# Patient Record
Sex: Male | Born: 1950 | Race: Black or African American | Hispanic: No | Marital: Married | Smoking: Former smoker
Health system: Southern US, Community
[De-identification: ages and names within clinical notes are randomized; demographics above are authoritative.]

## PROBLEM LIST (undated history)

## (undated) DIAGNOSIS — N289 Disorder of kidney and ureter, unspecified: Secondary | ICD-10-CM

## (undated) DIAGNOSIS — H409 Unspecified glaucoma: Secondary | ICD-10-CM

## (undated) DIAGNOSIS — H269 Unspecified cataract: Secondary | ICD-10-CM

---

## 1998-10-21 ENCOUNTER — Encounter: Payer: Self-pay | Admitting: Internal Medicine

## 1998-10-21 ENCOUNTER — Ambulatory Visit (HOSPITAL_COMMUNITY): Admission: RE | Admit: 1998-10-21 | Discharge: 1998-10-21 | Payer: Self-pay | Admitting: Internal Medicine

## 1998-10-21 ENCOUNTER — Emergency Department (HOSPITAL_COMMUNITY): Admission: EM | Admit: 1998-10-21 | Discharge: 1998-10-21 | Payer: Self-pay | Admitting: Internal Medicine

## 2000-10-08 ENCOUNTER — Emergency Department (HOSPITAL_COMMUNITY): Admission: EM | Admit: 2000-10-08 | Discharge: 2000-10-09 | Payer: Self-pay | Admitting: Emergency Medicine

## 2000-10-09 ENCOUNTER — Encounter: Payer: Self-pay | Admitting: Emergency Medicine

## 2000-10-13 ENCOUNTER — Emergency Department (HOSPITAL_COMMUNITY): Admission: EM | Admit: 2000-10-13 | Discharge: 2000-10-13 | Payer: Self-pay | Admitting: Emergency Medicine

## 2000-10-13 ENCOUNTER — Encounter: Payer: Self-pay | Admitting: Emergency Medicine

## 2003-06-27 ENCOUNTER — Ambulatory Visit (HOSPITAL_COMMUNITY): Admission: RE | Admit: 2003-06-27 | Discharge: 2003-06-27 | Payer: Self-pay | Admitting: Gastroenterology

## 2003-09-04 ENCOUNTER — Emergency Department (HOSPITAL_COMMUNITY): Admission: EM | Admit: 2003-09-04 | Discharge: 2003-09-04 | Payer: Self-pay | Admitting: Emergency Medicine

## 2003-10-02 ENCOUNTER — Ambulatory Visit (HOSPITAL_COMMUNITY): Admission: RE | Admit: 2003-10-02 | Discharge: 2003-10-02 | Payer: Self-pay | Admitting: Orthopedic Surgery

## 2004-08-06 ENCOUNTER — Ambulatory Visit (HOSPITAL_COMMUNITY): Admission: RE | Admit: 2004-08-06 | Discharge: 2004-08-07 | Payer: Self-pay | Admitting: Otolaryngology

## 2004-08-06 ENCOUNTER — Encounter (INDEPENDENT_AMBULATORY_CARE_PROVIDER_SITE_OTHER): Payer: Self-pay | Admitting: Specialist

## 2005-09-06 ENCOUNTER — Emergency Department (HOSPITAL_COMMUNITY): Admission: EM | Admit: 2005-09-06 | Discharge: 2005-09-06 | Payer: Self-pay | Admitting: Emergency Medicine

## 2006-10-19 ENCOUNTER — Emergency Department (HOSPITAL_COMMUNITY): Admission: EM | Admit: 2006-10-19 | Discharge: 2006-10-19 | Payer: Self-pay | Admitting: Emergency Medicine

## 2008-02-03 IMAGING — CT CT ABDOMEN W/O CM
1 series · 15 of 32 positions shown, 19 images · IV contrast (agent unspecified)
Comparison: none

CLINICAL DATA: Left flank pain.
ABDOMEN CT WITHOUT CONTRAST:
TECHNIQUE: Multidetector CT imaging of the abdomen was performed following the standard protocol without IV contrast.
TECHNIQUE: Multidetector CT imaging of the pelvis was performed following the standard protocol without IV contrast.

[Series 2: 160 stone 5.0 b40f st · axial · 0.69mm/px · z∈[-418,-82]mm · 15 of 93 slices shown, 19 images]
[im 6/93  soft-tissue]
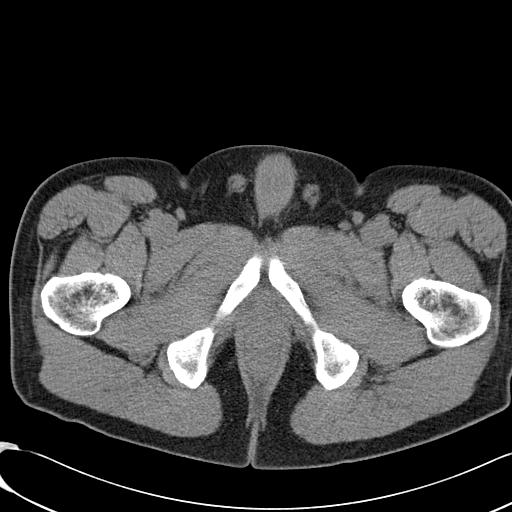
[im 6/93  bone]
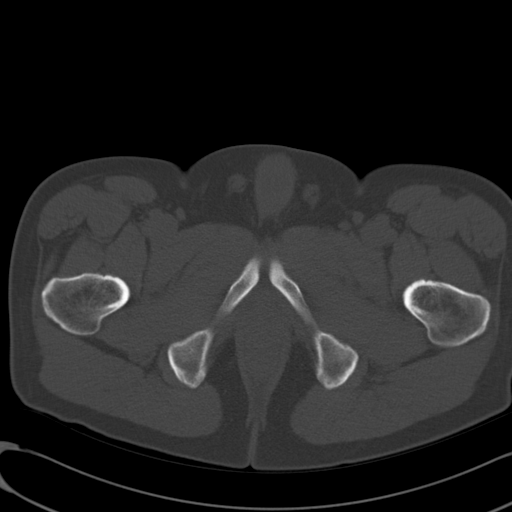
[im 12/93  soft-tissue]
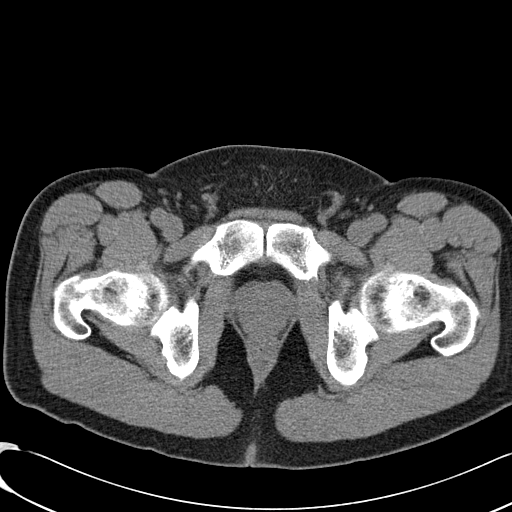
[im 18/93  soft-tissue]
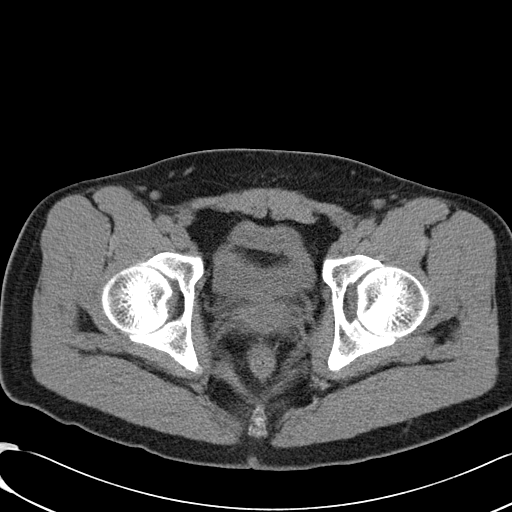
[im 27/93  soft-tissue]
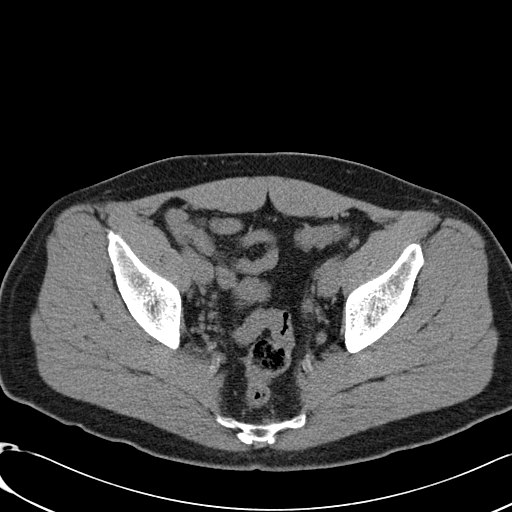
[im 33/93  soft-tissue]
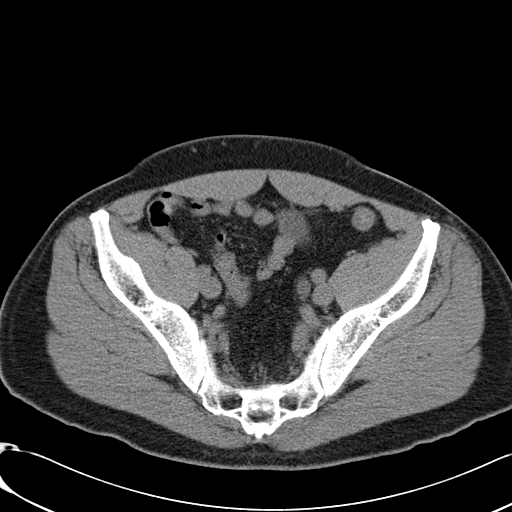
[im 39/93  soft-tissue]
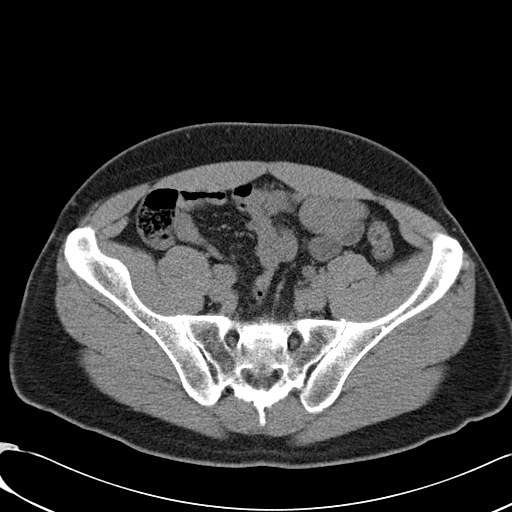
[im 48/93  soft-tissue]
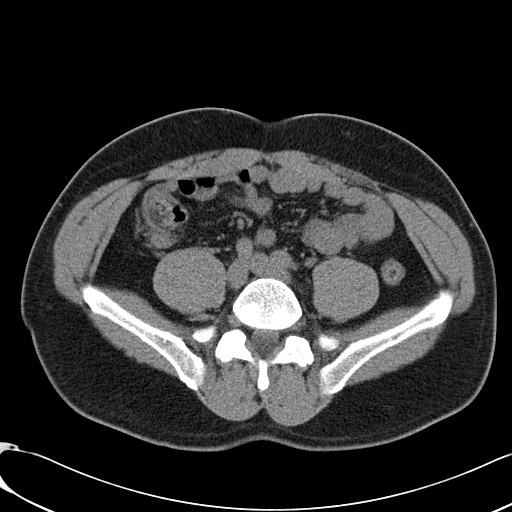
[im 54/93  soft-tissue]
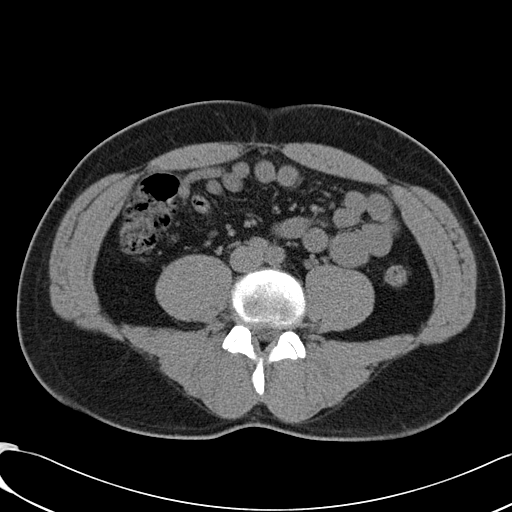
[im 60/93  soft-tissue]
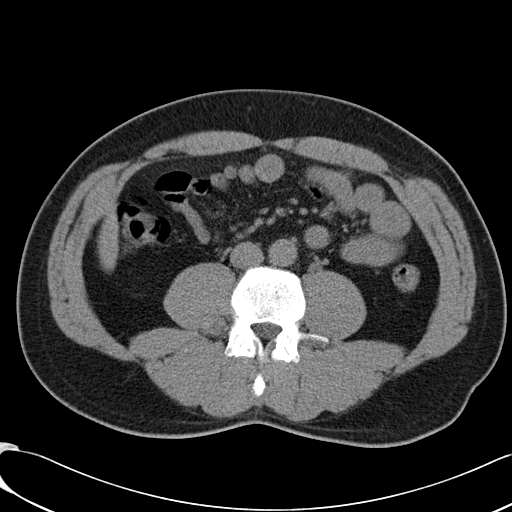
[im 60/93  bone]
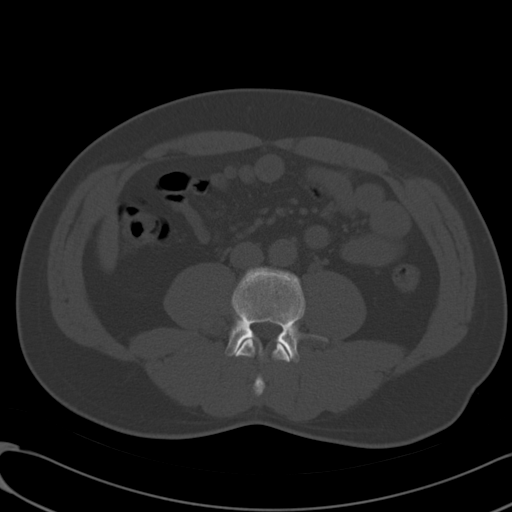
[im 66/93  soft-tissue]
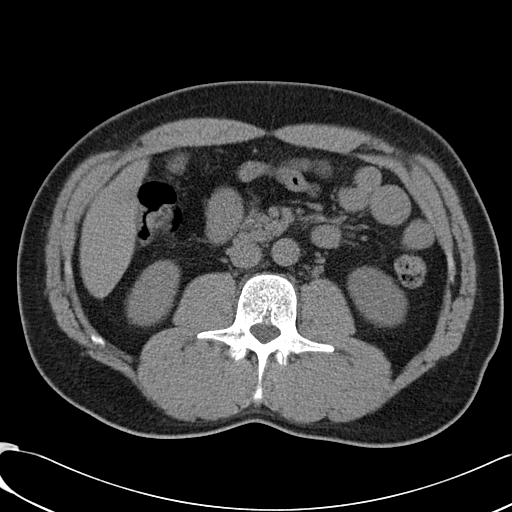
[im 75/93  soft-tissue]
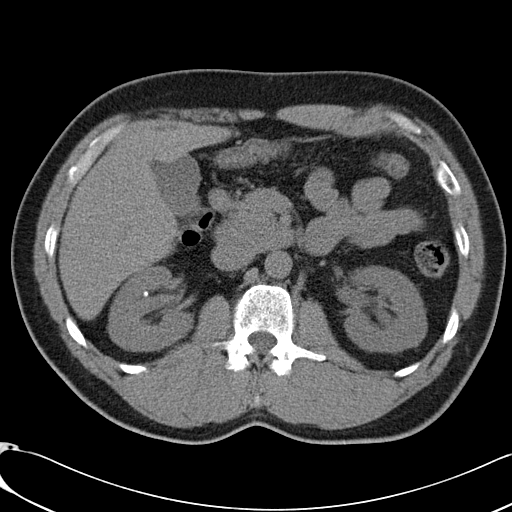
[im 81/93  soft-tissue]
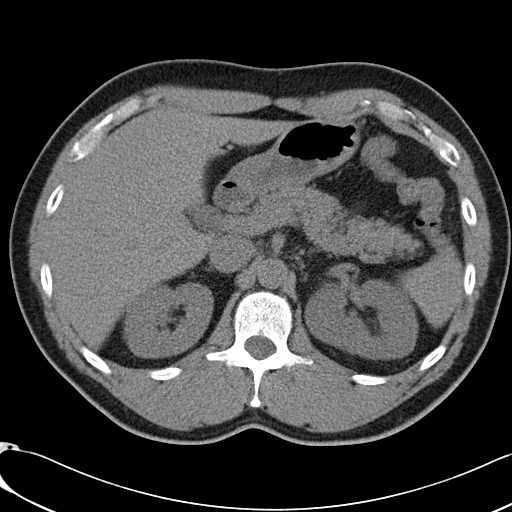
[im 81/93  lung]
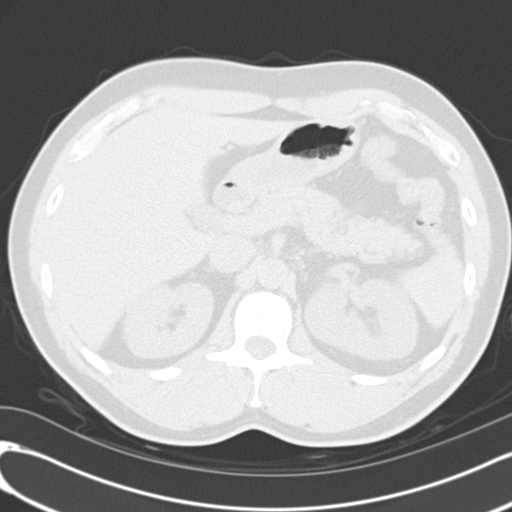
[im 84/93  lung]
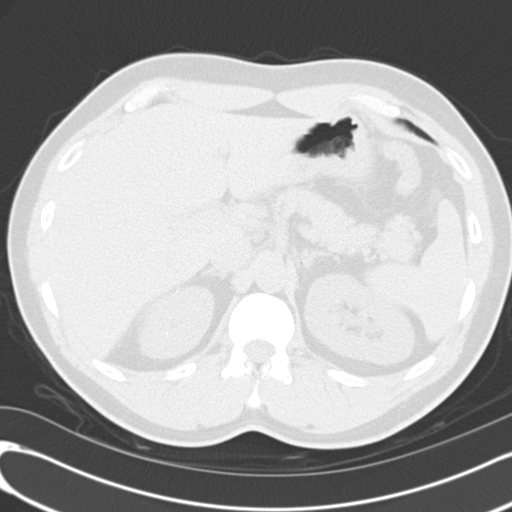
[im 87/93  soft-tissue]
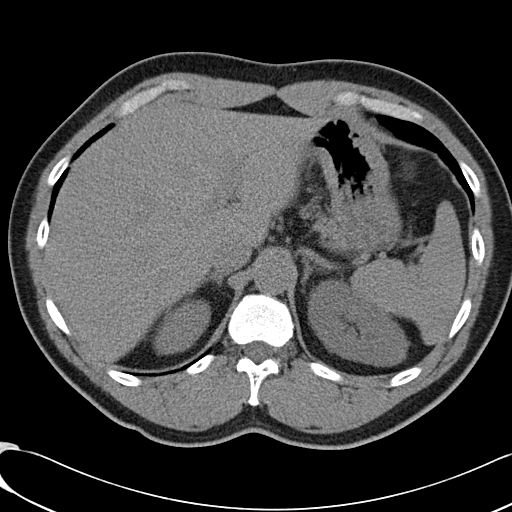
[im 87/93  lung]
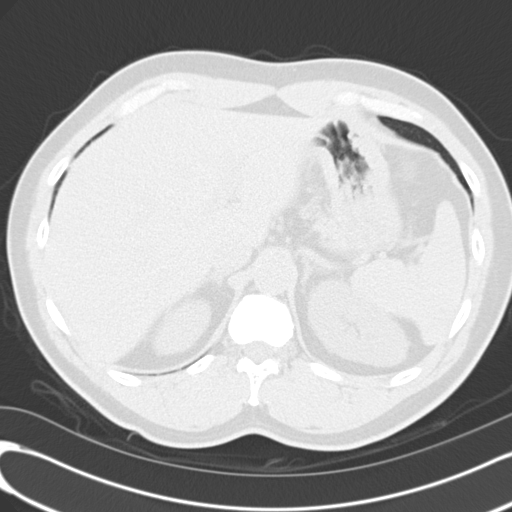
[im 90/93  lung]
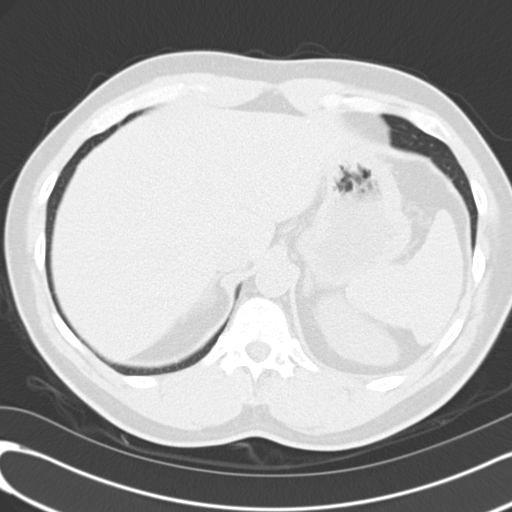

[15 of 32 positions shown; findings below may reference images not displayed]

FINDINGS: There are no renal calculi on the left.  Mild fullness of the left collecting system is present which may be due to a distal ureteral calculus as described in the pelvis report.  No perinephric edema or other signs of obstruction are seen.  There is a 4 mm stone in the right mid kidney without obstruction.  There is a 2.3 mm cyst in the right lower pole.  No adenopathy or mass is identified.
IMPRESSION: 1.  Question of mild obstruction of the left kidney due to a distal obstructing stone.  It is possible this is a phlebolith in the pelvis and there is no obstruction. 
2.  A 3 mm non-obstructing stone in the right mid kidney.
PELVIS CT WITHOUT CONTRAST:
FINDINGS: Calcification in the left mid pelvis on image #59 is felt to be due to arterial calcification.  There is a 3 mm round calcification in the left pelvis on image #70 which has some surrounding edema.  This is in the area of the ureter and could be a ureteral stone causing partial obstruction of the left ureter.  It is possible this is a phlebolith.  No bladder are seen.  The bowel is not dilated.  There is no free fluid.
IMPRESSION: A 3 mm calcification in the left lower pelvis which could be in the distal left ureter causing partial obstruction.  It is possible this is an unrelated phlebolith.

## 2010-06-27 NOTE — Op Note (Signed)
[name], Alan [name]                          ACCOUNT NO.:  [id number]   MEDICAL RECORD NO.:  [id number]                   PATIENT TYPE:  AMB   LOCATION:  ENDO                                 FACILITY:  [facility]   PHYSICIAN:  Anselmo Rod, M.D.               DATE OF BIRTH:  December 10, 1950   DATE OF PROCEDURE:  06/27/2003  DATE OF DISCHARGE:                                 OPERATIVE REPORT   PROCEDURE PERFORMED:  Screening colonoscopy.   ENDOSCOPIST:  Charna Elizabeth, M.D.   INSTRUMENT USED:  Olympus video colonoscope.   INDICATIONS FOR PROCEDURE:  The patient is a 61 year old African-American  male undergoing screening colonoscopy to rule out colonic polyps, masses,  etc.   PREPROCEDURE PREPARATION:  Informed consent was procured from the patient.  The patient was fasted for eight hours prior to the procedure and prepped  with a bottle of magnesium citrate and a gallon of GoLYTELY the night prior  to the procedure.   PREPROCEDURE PHYSICAL:  The patient had stable vital signs.  Neck supple.  Chest clear to auscultation.  S1 and S2 regular.  Abdomen soft with normal  bowel sounds.   DESCRIPTION OF PROCEDURE:  The patient was placed in left lateral decubitus  position and sedated with 90 mg of Demerol and 9 mg of Versed intravenously  in slow incremental doses.  Once the patient was adequately sedated and  maintained on low flow oxygen and continuous cardiac monitoring, the Olympus  video colonoscope was advanced from the rectum to the cecum.  The  appendicular orifice and ileocecal valve were clearly visualized and  photographed.  No masses, polyps, erosions, ulcerations or diverticula were  seen.  The patient had a fairly good prep.  Retroflexion in the rectum  revealed small internal hemorrhoids.  The patient tolerated the procedure  well without any immediate complications.   IMPRESSION:  Normal colonoscopy up to the cecum except for small internal  hemorrhoids.  No masses, polyps,  or diverticula were seen.   RECOMMENDATIONS:  1. Continue high fiber diet with liberal fluid intake.  2. Repeat colorectal cancer screening is recommended in the next 10 years     unless the patient develops any abnormal symptoms in the interim.  3. Outpatient followup as need arises in the future.                                               Anselmo Rod, M.D.    JNM/MEDQ  D:  06/27/2003  T:  06/27/2003  Job:  [id number]   cc:   Merlene Laughter. Renae Gloss, M.D.  [address]  [address]  [place]  [place] [address]  Fax: [phone]

## 2010-06-27 NOTE — Op Note (Signed)
NAMEKHAIDEN, REICHLE              ACCOUNT NO.:  [id number]   MEDICAL RECORD NO.:  [id number]          PATIENT TYPE:  OIB   LOCATION:  2550                         FACILITY:  [facility]   PHYSICIAN:  Jefry H. Pollyann Kennedy, MD     DATE OF BIRTH:  [birth year]   DATE OF PROCEDURE:  08/06/2004  DATE OF DISCHARGE:                                 OPERATIVE REPORT   PREOPERATIVE DIAGNOSES:  1.  Tonsillar hypertrophy.  2.  Elongation and thickening of uvula.  3.  Obstructive sleep apnea syndrome.   POSTOPERATIVE DIAGNOSES:  1.  Tonsillar hypertrophy.  2.  Elongation and thickening of uvula.  3.  Obstructive sleep apnea syndrome.   PROCEDURES:  1.  Tonsillectomy.  2.  Uvulectomy.   SURGEON:  Jefry H. Pollyann Kennedy, M.D.   ANESTHESIA:  General endotracheal anesthesia was used.   COMPLICATIONS:  None.   FINDINGS:  Moderate to severe enlargement of the tonsils with thickened,  elongated and redundant mucosa of the uvula.   REFERRING PHYSICIAN:  Merlene Laughter. Renae Gloss, M.D..   BLOOD LOSS:  30 mL.   SPECIMENS:  Tonsils and uvula sent for pathological evaluation.   HISTORY:  This is a 60 year old history of loud snoring and very mild  obstructive sleep apnea syndrome. The risks, benefits, alternatives and  complications of the procedure were explained to the patient, who seemed to  understand and agreed to surgery.   PROCEDURE:  The patient was taken to the operating room and placed on the  operative table in the supine position. Following induction of general  endotracheal anesthesia, the table was turned 90 degrees. The patient was  draped in standard fashion. The Crowe-Davis mouth gag was inserted into the  oral cavity and used to retract the tongue and mandible and attached to the  Mayo stand. A red rubber catheter was inserted into the right side of the  nose and withdrawn through the mouth and used to retract the soft palate and  uvula. Tonsillectomy was performed using electrocautery  dissection,  carefully dissecting the avascular plane between the capsule and constrictor  muscles. There was significant fibrosis around the capsule inferiorly on the  left side, but no signs of abscess or any tumor. The tonsils were sent for  pathologic evaluation along with the uvula when that was removed. The uvula  was removed using electrocautery dissection, dissecting along the anterior  mucosa through the muscular layer, which was very thickened and then through  the posterior layer slightly lower than the anterior layer. Spot cautery was  used as needed in the tonsillar beds and the uvula bed for hemostasis. The  mucosa was reapproximated along the corners and along the palate edge using  4-0 Vicryl suture. The lower aspects of the tonsillar fossae were left open.  The pharynx was suctioned of blood secretions and irrigated with saline  solution. An orogastric tube was used to aspirate the contents of the  stomach. The patient was then awakened, extubated and transferred to  recovery in stable condition.       JHR/MEDQ  D:  08/06/2004  T:  08/06/2004  Job:  [id number]   cc:   Merlene Laughter. Renae Gloss, M.D.  [address]  [address]  [place]  [place] [address]  Fax: [phone]

## 2010-11-21 LAB — URINALYSIS, ROUTINE W REFLEX MICROSCOPIC
Nitrite: NEGATIVE
Protein, ur: 100 — AB
Urobilinogen, UA: 1
pH: 6

## 2010-11-21 LAB — URINE MICROSCOPIC-ADD ON

## 2010-11-21 LAB — URINE CULTURE

## 2015-06-13 DIAGNOSIS — H25813 Combined forms of age-related cataract, bilateral: Secondary | ICD-10-CM | POA: Diagnosis not present

## 2015-06-13 DIAGNOSIS — H40003 Preglaucoma, unspecified, bilateral: Secondary | ICD-10-CM | POA: Diagnosis not present

## 2015-06-19 DIAGNOSIS — E785 Hyperlipidemia, unspecified: Secondary | ICD-10-CM | POA: Diagnosis not present

## 2015-06-19 DIAGNOSIS — H409 Unspecified glaucoma: Secondary | ICD-10-CM | POA: Diagnosis not present

## 2015-06-19 DIAGNOSIS — I10 Essential (primary) hypertension: Secondary | ICD-10-CM | POA: Diagnosis not present

## 2015-07-24 DIAGNOSIS — H40003 Preglaucoma, unspecified, bilateral: Secondary | ICD-10-CM | POA: Diagnosis not present

## 2015-12-11 DIAGNOSIS — Z0000 Encounter for general adult medical examination without abnormal findings: Secondary | ICD-10-CM | POA: Diagnosis not present

## 2015-12-11 DIAGNOSIS — E785 Hyperlipidemia, unspecified: Secondary | ICD-10-CM | POA: Diagnosis not present

## 2015-12-11 DIAGNOSIS — I10 Essential (primary) hypertension: Secondary | ICD-10-CM | POA: Diagnosis not present

## 2015-12-11 DIAGNOSIS — E559 Vitamin D deficiency, unspecified: Secondary | ICD-10-CM | POA: Diagnosis not present

## 2015-12-26 DIAGNOSIS — H40003 Preglaucoma, unspecified, bilateral: Secondary | ICD-10-CM | POA: Diagnosis not present

## 2016-06-10 DIAGNOSIS — E785 Hyperlipidemia, unspecified: Secondary | ICD-10-CM | POA: Diagnosis not present

## 2016-06-10 DIAGNOSIS — I10 Essential (primary) hypertension: Secondary | ICD-10-CM | POA: Diagnosis not present

## 2016-08-06 DIAGNOSIS — H25813 Combined forms of age-related cataract, bilateral: Secondary | ICD-10-CM | POA: Diagnosis not present

## 2016-08-06 DIAGNOSIS — H40003 Preglaucoma, unspecified, bilateral: Secondary | ICD-10-CM | POA: Diagnosis not present

## 2016-12-08 DIAGNOSIS — M542 Cervicalgia: Secondary | ICD-10-CM | POA: Diagnosis not present

## 2016-12-15 DIAGNOSIS — M542 Cervicalgia: Secondary | ICD-10-CM | POA: Diagnosis not present

## 2016-12-16 DIAGNOSIS — R7309 Other abnormal glucose: Secondary | ICD-10-CM | POA: Diagnosis not present

## 2016-12-16 DIAGNOSIS — M542 Cervicalgia: Secondary | ICD-10-CM | POA: Diagnosis not present

## 2016-12-16 DIAGNOSIS — Z125 Encounter for screening for malignant neoplasm of prostate: Secondary | ICD-10-CM | POA: Diagnosis not present

## 2016-12-16 DIAGNOSIS — I10 Essential (primary) hypertension: Secondary | ICD-10-CM | POA: Diagnosis not present

## 2016-12-16 DIAGNOSIS — Z0000 Encounter for general adult medical examination without abnormal findings: Secondary | ICD-10-CM | POA: Diagnosis not present

## 2016-12-16 DIAGNOSIS — E785 Hyperlipidemia, unspecified: Secondary | ICD-10-CM | POA: Diagnosis not present

## 2016-12-16 DIAGNOSIS — H409 Unspecified glaucoma: Secondary | ICD-10-CM | POA: Diagnosis not present

## 2016-12-22 DIAGNOSIS — M542 Cervicalgia: Secondary | ICD-10-CM | POA: Diagnosis not present

## 2017-06-09 DIAGNOSIS — I10 Essential (primary) hypertension: Secondary | ICD-10-CM | POA: Diagnosis not present

## 2017-06-09 DIAGNOSIS — E785 Hyperlipidemia, unspecified: Secondary | ICD-10-CM | POA: Diagnosis not present

## 2017-08-01 ENCOUNTER — Other Ambulatory Visit: Payer: Self-pay

## 2017-08-01 ENCOUNTER — Emergency Department (HOSPITAL_BASED_OUTPATIENT_CLINIC_OR_DEPARTMENT_OTHER)
Admission: EM | Admit: 2017-08-01 | Discharge: 2017-08-01 | Disposition: A | Discharge status: Home / Self Care | Payer: Medicare Other | Attending: Emergency Medicine | Admitting: Emergency Medicine

## 2017-08-01 ENCOUNTER — Encounter (HOSPITAL_BASED_OUTPATIENT_CLINIC_OR_DEPARTMENT_OTHER): Payer: Self-pay | Admitting: Emergency Medicine

## 2017-08-01 DIAGNOSIS — R109 Unspecified abdominal pain: Secondary | ICD-10-CM | POA: Insufficient documentation

## 2017-08-01 DIAGNOSIS — R1030 Lower abdominal pain, unspecified: Secondary | ICD-10-CM | POA: Diagnosis not present

## 2017-08-01 DIAGNOSIS — N200 Calculus of kidney: Secondary | ICD-10-CM | POA: Diagnosis not present

## 2017-08-01 HISTORY — DX: Disorder of kidney and ureter, unspecified: N28.9

## 2017-08-01 HISTORY — DX: Unspecified glaucoma: H40.9

## 2017-08-01 HISTORY — DX: Unspecified cataract: H26.9

## 2017-08-01 LAB — CBC WITH DIFFERENTIAL/PLATELET
BASOS PCT: 0 %
Basophils Absolute: 0 10*3/uL (ref 0.0–0.1)
EOS ABS: 0.1 10*3/uL (ref 0.0–0.7)
EOS PCT: 1 %
HEMATOCRIT: 44.4 % (ref 39.0–52.0)
HEMOGLOBIN: 16.4 g/dL (ref 13.0–17.0)
LYMPHS PCT: 46 %
Lymphs Abs: 2.9 10*3/uL (ref 0.7–4.0)
MCH: 31.1 pg (ref 26.0–34.0)
MCHC: 36.9 g/dL — AB (ref 30.0–36.0)
MCV: 84.1 fL (ref 78.0–100.0)
MONOS PCT: 12 %
Monocytes Absolute: 0.7 10*3/uL (ref 0.1–1.0)
NEUTROS ABS: 2.5 10*3/uL (ref 1.7–7.7)
NEUTROS PCT: 41 %
Platelets: 134 10*3/uL — ABNORMAL LOW (ref 150–400)
RBC: 5.28 MIL/uL (ref 4.22–5.81)
RDW: 13.2 % (ref 11.5–15.5)
WBC: 6.2 10*3/uL (ref 4.0–10.5)

## 2017-08-01 LAB — BASIC METABOLIC PANEL
Anion gap: 9 (ref 5–15)
BUN: 15 mg/dL (ref 6–20)
CO2: 26 mmol/L (ref 22–32)
Calcium: 9.2 mg/dL (ref 8.9–10.3)
Chloride: 107 mmol/L (ref 101–111)
Creatinine, Ser: 1.24 mg/dL (ref 0.61–1.24)
GFR, EST NON AFRICAN AMERICAN: 58 mL/min — AB (ref >60)
Glucose, Bld: 92 mg/dL (ref 65–99)
POTASSIUM: 4.2 mmol/L (ref 3.5–5.1)
SODIUM: 142 mmol/L (ref 135–145)

## 2017-08-01 LAB — URINALYSIS, MICROSCOPIC (REFLEX)
Bacteria, UA: NONE SEEN
RBC / HPF: >50 RBC/hpf (ref 0–5)

## 2017-08-01 LAB — URINALYSIS, ROUTINE W REFLEX MICROSCOPIC
BILIRUBIN URINE: NEGATIVE
Glucose, UA: NEGATIVE mg/dL
KETONES UR: 15 mg/dL — AB
Leukocytes, UA: NEGATIVE
Nitrite: NEGATIVE
PROTEIN: NEGATIVE mg/dL
Specific Gravity, Urine: 1.025 (ref 1.005–1.030)
pH: 6.5 (ref 5.0–8.0)

## 2017-08-01 MED ORDER — FENTANYL CITRATE (PF) 100 MCG/2ML IJ SOLN
50.0000 ug | INTRAMUSCULAR | Status: DC | PRN
  Administered 2017-08-01: 50 ug via INTRAVENOUS
  Filled 2017-08-01: qty 2

## 2017-08-01 MED ORDER — IBUPROFEN 800 MG PO TABS: 800.0000 mg | ORAL_TABLET | Freq: Three times a day (TID) | ORAL | 0 refills | Status: DC

## 2017-08-01 MED ORDER — ONDANSETRON HCL 4 MG/2ML IJ SOLN
4.0000 mg | Freq: Once | INTRAMUSCULAR | Status: AC
  Administered 2017-08-01: 4 mg via INTRAVENOUS
  Filled 2017-08-01: qty 2

## 2017-08-01 MED ORDER — TAMSULOSIN HCL 0.4 MG PO CAPS
0.4000 mg | ORAL_CAPSULE | Freq: Once | ORAL | Status: AC
  Administered 2017-08-01: 0.4 mg via ORAL
  Filled 2017-08-01: qty 1

## 2017-08-01 MED ORDER — KETOROLAC TROMETHAMINE 60 MG/2ML IM SOLN
30.0000 mg | Freq: Once | INTRAMUSCULAR | Status: AC
  Administered 2017-08-01: 30 mg via INTRAMUSCULAR
  Filled 2017-08-01: qty 2

## 2017-08-01 MED ORDER — HYDROMORPHONE HCL 1 MG/ML IJ SOLN
0.5000 mg | Freq: Once | INTRAMUSCULAR | Status: AC
  Administered 2017-08-01: 0.5 mg via INTRAVENOUS
  Filled 2017-08-01: qty 1

## 2017-08-01 MED ORDER — ONDANSETRON HCL 4 MG PO TABS: 4.0000 mg | ORAL_TABLET | Freq: Three times a day (TID) | ORAL | 0 refills | Status: DC | PRN

## 2017-08-01 MED ORDER — TAMSULOSIN HCL 0.4 MG PO CAPS: 0.4000 mg | ORAL_CAPSULE | Freq: Every day | ORAL | 0 refills | Status: DC

## 2017-08-01 MED ORDER — OXYCODONE-ACETAMINOPHEN 5-325 MG PO TABS: 1.0000 | ORAL_TABLET | Freq: Four times a day (QID) | ORAL | 0 refills | Status: DC | PRN

## 2017-08-01 NOTE — ED Notes (Signed)
Alert, NAD, calm, interactive, resps e/u, speaking in clear complete sentences, no dyspnea noted, skin W&D, pain decreased, "feel better", (denies: sob, NVD, fever, chills, dizziness or visual changes), h/o 6-7 kidney stones, last stone 10 yrs ago, seen by Dr. Lynnae Sandhoffahlsted ([facility]). [facility]

## 2017-08-01 NOTE — ED Notes (Signed)
Alert, NAD, calm, interactive, watching TV. Pending lab results.

## 2017-08-01 NOTE — ED Triage Notes (Signed)
Patient states that he has pain to his right upper abdominal area x 1 hour. The patient denies any N/V

## 2017-08-03 NOTE — ED Provider Notes (Signed)
[facility] EMERGENCY DEPARTMENT Provider Note   CSN: [id number] Arrival date & time: 08/01/17  2035     History   Chief Complaint Chief Complaint  Patient presents with  . Flank Pain    HPI Alan [name] is a 67 y.o. male.  The history is provided by the patient and the spouse.  Flank Pain  This is a recurrent problem. The current episode started 3 to 5 hours ago. The problem occurs constantly. The problem has been gradually worsening. Pertinent negatives include no chest pain and no abdominal pain. Nothing aggravates the symptoms. Nothing relieves the symptoms.    Past Medical History:  Diagnosis Date  . Cataracts, bilateral   . Glaucoma   . Renal disorder    kideny stone    There are no active problems to display for this patient.   History reviewed. No pertinent surgical history.      Home Medications    Prior to Admission medications   Medication Sig Start Date End Date Taking? Authorizing Provider  ibuprofen (ADVIL,MOTRIN) 800 MG tablet Take 1 tablet (800 mg total) by mouth 3 (three) times daily. 08/01/17   Lynnlee Revels, Barbara Cower, MD  ondansetron (ZOFRAN) 4 MG tablet Take 1 tablet (4 mg total) by mouth every 8 (eight) hours as needed for nausea or vomiting. 08/01/17   Preet Mangano, Barbara Cower, MD  oxyCODONE-acetaminophen (PERCOCET) 5-325 MG tablet Take 1-2 tablets by mouth every 6 (six) hours as needed for severe pain. 08/01/17   Anshi Jalloh, Barbara Cower, MD  tamsulosin (FLOMAX) 0.4 MG CAPS capsule Take 1 capsule (0.4 mg total) by mouth daily. 08/01/17   Santonio Speakman, Barbara Cower, MD    Family History History reviewed. No pertinent family history.  Social History Social History   Tobacco Use  . Smoking status: Never Smoker  . Smokeless tobacco: Never Used  Substance Use Topics  . Alcohol use: Never    Frequency: Never  . Drug use: Never     Allergies   Patient has no known allergies.   Review of Systems Review of Systems  Cardiovascular: Negative for chest pain.    Gastrointestinal: Negative for abdominal pain.  Genitourinary: Positive for flank pain.  All other systems reviewed and are negative.    Physical Exam Updated Vital Signs BP 140/85   Pulse (!) 52   Temp 98.5 F (36.9 C) (Oral)   Resp 20   Ht 5\' 10"  (1.778 m)   Wt 82.6 kg (182 lb)   SpO2 98%   BMI 26.11 kg/m   Physical Exam  Constitutional: He is oriented to person, place, and time. He appears well-developed and well-nourished.  HENT:  Head: Normocephalic and atraumatic.  Eyes: Conjunctivae and EOM are normal.  Neck: Normal range of motion.  Cardiovascular: Normal rate.  Pulmonary/Chest: Effort normal. No respiratory distress.  Abdominal: Soft. He exhibits no distension. There is no tenderness. There is no guarding.  Musculoskeletal: Normal range of motion.  Neurological: He is alert and oriented to person, place, and time.  Nursing note and vitals reviewed.    ED Treatments / Results  Labs (all labs ordered are listed, but only abnormal results are displayed) Labs Reviewed  URINALYSIS, ROUTINE W REFLEX MICROSCOPIC - Abnormal; Notable for the following components:      Result Value   APPearance CLOUDY (*)    Hgb urine dipstick LARGE (*)    Ketones, ur 15 (*)    All other components within normal limits  CBC WITH DIFFERENTIAL/PLATELET - Abnormal; Notable for the following  components:   MCHC 36.9 (*)    Platelets 134 (*)    All other components within normal limits  BASIC METABOLIC PANEL - Abnormal; Notable for the following components:   GFR calc non Af Amer 58 (*)    All other components within normal limits  URINALYSIS, MICROSCOPIC (REFLEX)    EKG None  Radiology No results found.  Procedures Procedures (including critical care time)  Medications Ordered in ED Medications  ondansetron (ZOFRAN) injection 4 mg (4 mg Intravenous Given 08/01/17 2058)  tamsulosin (FLOMAX) capsule 0.4 mg (0.4 mg Oral Given 08/01/17 2239)  ketorolac (TORADOL) injection 30  mg (30 mg Intramuscular Given 08/01/17 2238)  HYDROmorphone (DILAUDID) injection 0.5 mg (0.5 mg Intravenous Given 08/01/17 2239)     Initial Impression / Assessment and Plan / ED Course  I have reviewed the triage vital signs and the nursing notes.  Pertinent labs & imaging results that were available during my care of the patient were reviewed by me and considered in my medical decision making (see chart for details).     With history of kidney stones multiple times this feels exactly the same.  Is in his right lower quadrant but this generally where he gets his kidney stone pain.  He does not have any rebound or guarding.  White count is normal.  He is afebrile.  He has no nausea or vomiting to suggest appendicitis or other infectious causes for symptoms.  Will treat for kidney stone and have urology follow-up.  Final Clinical Impressions(s) / ED Diagnoses   Final diagnoses:  Right flank pain  Kidney stone    ED Discharge Orders        Ordered    oxyCODONE-acetaminophen (PERCOCET) 5-325 MG tablet  Every 6 hours PRN     08/01/17 2308    ondansetron (ZOFRAN) 4 MG tablet  Every 8 hours PRN     08/01/17 2308    tamsulosin (FLOMAX) 0.4 MG CAPS capsule  Daily     08/01/17 2308    ibuprofen (ADVIL,MOTRIN) 800 MG tablet  3 times daily     08/01/17 2308       Lliam Hoh, Barbara CowerJason, MD 08/03/17 1224

## 2017-09-01 DIAGNOSIS — H25812 Combined forms of age-related cataract, left eye: Secondary | ICD-10-CM | POA: Diagnosis not present

## 2017-09-01 DIAGNOSIS — H40003 Preglaucoma, unspecified, bilateral: Secondary | ICD-10-CM | POA: Diagnosis not present

## 2017-09-01 DIAGNOSIS — H25811 Combined forms of age-related cataract, right eye: Secondary | ICD-10-CM | POA: Diagnosis not present

## 2017-09-10 ENCOUNTER — Other Ambulatory Visit: Payer: Self-pay

## 2017-10-13 DIAGNOSIS — H25812 Combined forms of age-related cataract, left eye: Secondary | ICD-10-CM | POA: Diagnosis not present

## 2017-10-13 DIAGNOSIS — H5310 Unspecified subjective visual disturbances: Secondary | ICD-10-CM | POA: Diagnosis not present

## 2017-10-13 DIAGNOSIS — H25811 Combined forms of age-related cataract, right eye: Secondary | ICD-10-CM | POA: Diagnosis not present

## 2017-10-13 DIAGNOSIS — H40003 Preglaucoma, unspecified, bilateral: Secondary | ICD-10-CM | POA: Diagnosis not present

## 2017-11-25 DIAGNOSIS — Z23 Encounter for immunization: Secondary | ICD-10-CM | POA: Diagnosis not present

## 2017-12-22 ENCOUNTER — Ambulatory Visit: Payer: Medicare Other

## 2017-12-22 ENCOUNTER — Encounter: Payer: Self-pay | Admitting: Nurse Practitioner

## 2017-12-22 ENCOUNTER — Ambulatory Visit: Payer: Self-pay | Admitting: Nurse Practitioner

## 2017-12-22 ENCOUNTER — Ambulatory Visit (INDEPENDENT_AMBULATORY_CARE_PROVIDER_SITE_OTHER): Payer: Medicare Other | Admitting: Nurse Practitioner

## 2017-12-22 VITALS — BP 120/80 | HR 60 | Temp 97.6°F | Ht 68.5 in | Wt 181.0 lb

## 2017-12-22 VITALS — BP 128/80 | HR 60 | Temp 97.6°F | Ht 68.5 in | Wt 181.8 lb

## 2017-12-22 DIAGNOSIS — Z125 Encounter for screening for malignant neoplasm of prostate: Secondary | ICD-10-CM | POA: Diagnosis not present

## 2017-12-22 DIAGNOSIS — Z1159 Encounter for screening for other viral diseases: Secondary | ICD-10-CM | POA: Diagnosis not present

## 2017-12-22 DIAGNOSIS — Z0000 Encounter for general adult medical examination without abnormal findings: Secondary | ICD-10-CM

## 2017-12-22 DIAGNOSIS — I10 Essential (primary) hypertension: Secondary | ICD-10-CM | POA: Insufficient documentation

## 2017-12-22 LAB — POCT URINALYSIS DIPSTICK
Bilirubin, UA: NEGATIVE
Glucose, UA: NEGATIVE
Ketones, UA: NEGATIVE
LEUKOCYTES UA: NEGATIVE
NITRITE UA: NEGATIVE
PH UA: 7 (ref 5.0–8.0)
Protein, UA: NEGATIVE
Spec Grav, UA: 1.02 (ref 1.010–1.025)
UROBILINOGEN UA: 0.2 U/dL

## 2017-12-22 LAB — POCT UA - MICROALBUMIN
Albumin/Creatinine Ratio, Urine, POC: 30
CREATININE, POC: 200 mg/dL
Microalbumin Ur, POC: 10 mg/L

## 2017-12-22 NOTE — Patient Instructions (Signed)
[name] , Thank you for taking time to come for your Medicare Wellness Visit. I appreciate your ongoing commitment to your health goals. Please review the following plan we discussed and let me know if I can assist you in the future.   Screening recommendations/referrals: Colonoscopy: up to date Recommended yearly ophthalmology/optometry visit for glaucoma screening and checkup Recommended yearly dental visit for hygiene and checkup  Vaccinations: Influenza vaccine: 11/2017 Pneumococcal vaccine: pt. Will verify Tdap vaccine: 11/2012 Shingles vaccine: dec    Advanced directives: Advance directive discussed with you today. I have provided a copy for you to complete at home and have notarized. Once this is complete please bring a copy in to our office so we can scan it into your chart.  Conditions/risks identified: Overweight  Next appointment:   Preventive Care 6767 Years and Older, Male Preventive care refers to lifestyle choices and visits with your health care provider that can promote health and wellness. What does preventive care include?  A yearly physical exam. This is also called an annual well check.  Dental exams once or twice a year.  Routine eye exams. Ask your health care provider how often you should have your eyes checked.  Personal lifestyle choices, including:  Daily care of your teeth and gums.  Regular physical activity.  Eating a healthy diet.  Avoiding tobacco and drug use.  Limiting alcohol use.  Practicing safe sex.  Taking low doses of aspirin every day.  Taking vitamin and mineral supplements as recommended by your health care provider. What happens during an annual well check? The services and screenings done by your health care provider during your annual well check will depend on your age, overall health, lifestyle risk factors, and family history of disease. Counseling  Your health care provider may ask you questions about  your:  Alcohol use.  Tobacco use.  Drug use.  Emotional well-being.  Home and relationship well-being.  Sexual activity.  Eating habits.  History of falls.  Memory and ability to understand (cognition).  Work and work Astronomerenvironment. Screening  You may have the following tests or measurements:  Height, weight, and BMI.  Blood pressure.  Lipid and cholesterol levels. These may be checked every 5 years, or more frequently if you are over 67 years old.  Skin check.  Lung cancer screening. You may have this screening every year starting at age 67 if you have a 30-pack-year history of smoking and currently smoke or have quit within the past 15 years.  Fecal occult blood test (FOBT) of the stool. You may have this test every year starting at age 67.  Flexible sigmoidoscopy or colonoscopy. You may have a sigmoidoscopy every 5 years or a colonoscopy every 10 years starting at age 67.  Prostate cancer screening. Recommendations will vary depending on your family history and other risks.  Hepatitis C blood test.  Hepatitis B blood test.  Sexually transmitted disease (STD) testing.  Diabetes screening. This is done by checking your blood sugar (glucose) after you have not eaten for a while (fasting). You may have this done every 1-3 years.  Abdominal aortic aneurysm (AAA) screening. You may need this if you are a current or former smoker.  Osteoporosis. You may be screened starting at age 67 if you are at high risk. Talk with your health care provider about your test results, treatment options, and if necessary, the need for more tests. Vaccines  Your health care provider may recommend certain vaccines, such as:  Influenza vaccine. This is recommended every year.  Tetanus, diphtheria, and acellular pertussis (Tdap, Td) vaccine. You may need a Td booster every 10 years.  Zoster vaccine. You may need this after age 24.  Pneumococcal 13-valent conjugate (PCV13) vaccine.  One dose is recommended after age 9.  Pneumococcal polysaccharide (PPSV23) vaccine. One dose is recommended after age 72. Talk to your health care provider about which screenings and vaccines you need and how often you need them. This information is not intended to replace advice given to you by your health care provider. Make sure you discuss any questions you have with your health care provider. Document Released: 02/22/2015 Document Revised: 10/16/2015 Document Reviewed: 11/27/2014 Elsevier Interactive Patient Education  2017 [place] Prevention in the Home Falls can cause injuries. They can happen to people of all ages. There are many things you can do to make your home safe and to help prevent falls. What can I do on the outside of my home?  Regularly fix the edges of walkways and driveways and fix any cracks.  Remove anything that might make you trip as you walk through a door, such as a raised step or threshold.  Trim any bushes or trees on the path to your home.  Use bright outdoor lighting.  Clear any walking paths of anything that might make someone trip, such as rocks or tools.  Regularly check to see if handrails are loose or broken. Make sure that both sides of any steps have handrails.  Any raised decks and porches should have guardrails on the edges.  Have any leaves, snow, or ice cleared regularly.  Use sand or salt on walking paths during winter.  Clean up any spills in your garage right away. This includes oil or grease spills. What can I do in the bathroom?  Use night lights.  Install grab bars by the toilet and in the tub and shower. Do not use towel bars as grab bars.  Use non-skid mats or decals in the tub or shower.  If you need to sit down in the shower, use a plastic, non-slip stool.  Keep the floor dry. Clean up any water that spills on the floor as soon as it happens.  Remove soap buildup in the tub or shower regularly.  Attach bath  mats securely with double-sided non-slip rug tape.  Do not have throw rugs and other things on the floor that can make you trip. What can I do in the bedroom?  Use night lights.  Make sure that you have a light by your bed that is easy to reach.  Do not use any sheets or blankets that are too big for your bed. They should not hang down onto the floor.  Have a firm chair that has side arms. You can use this for support while you get dressed.  Do not have throw rugs and other things on the floor that can make you trip. What can I do in the kitchen?  Clean up any spills right away.  Avoid walking on wet floors.  Keep items that you use a lot in easy-to-reach places.  If you need to reach something above you, use a strong step stool that has a grab bar.  Keep electrical cords out of the way.  Do not use floor polish or wax that makes floors slippery. If you must use wax, use non-skid floor wax.  Do not have throw rugs and other things on the floor that  can make you trip. What can I do with my stairs?  Do not leave any items on the stairs.  Make sure that there are handrails on both sides of the stairs and use them. Fix handrails that are broken or loose. Make sure that handrails are as long as the stairways.  Check any carpeting to make sure that it is firmly attached to the stairs. Fix any carpet that is loose or worn.  Avoid having throw rugs at the top or bottom of the stairs. If you do have throw rugs, attach them to the floor with carpet tape.  Make sure that you have a light switch at the top of the stairs and the bottom of the stairs. If you do not have them, ask someone to add them for you. What else can I do to help prevent falls?  Wear shoes that:  Do not have high heels.  Have rubber bottoms.  Are comfortable and fit you well.  Are closed at the toe. Do not wear sandals.  If you use a stepladder:  Make sure that it is fully opened. Do not climb a closed  stepladder.  Make sure that both sides of the stepladder are locked into place.  Ask someone to hold it for you, if possible.  Clearly mark and make sure that you can see:  Any grab bars or handrails.  First and last steps.  Where the edge of each step is.  Use tools that help you move around (mobility aids) if they are needed. These include:  Canes.  Walkers.  Scooters.  Crutches.  Turn on the lights when you go into a dark area. Replace any light bulbs as soon as they burn out.  Set up your furniture so you have a clear path. Avoid moving your furniture around.  If any of your floors are uneven, fix them.  If there are any pets around you, be aware of where they are.  Review your medicines with your doctor. Some medicines can make you feel dizzy. This can increase your chance of falling. Ask your doctor what other things that you can do to help prevent falls. This information is not intended to replace advice given to you by your health care provider. Make sure you discuss any questions you have with your health care provider. Document Released: 11/22/2008 Document Revised: 07/04/2015 Document Reviewed: 03/02/2014 Elsevier Interactive Patient Education  2017 [employer].

## 2017-12-22 NOTE — Progress Notes (Signed)
Subjective:     Patient ID: Alan [name] , male    DOB: 1950-12-26 , 66 y.o.   MRN: [id number]   Chief Complaint  Patient presents with  . Hypertension    HPI  Hypertension  This is a chronic problem. The current episode started more than 1 year ago. The problem is unchanged. The problem is controlled. Pertinent negatives include no anxiety, chest pain, headaches or palpitations.     Past Medical History:  Diagnosis Date  . Cataracts, bilateral   . Glaucoma   . Renal disorder    kideny stone     No family history on file.   Current Outpatient Medications:  .  amLODipine (NORVASC) 5 MG tablet, Take 5 mg by mouth daily., Disp: , Rfl:  .  Travoprost, BAK Free, (TRAVATAN Z) 0.004 % SOLN ophthalmic solution, Place 1 drop into both eyes at bedtime., Disp: , Rfl:    No Known Allergies   Review of Systems  Constitutional: Negative.   HENT: Negative.   Eyes: Negative.   Respiratory: Negative.   Cardiovascular: Negative.  Negative for chest pain, palpitations and leg swelling.  Gastrointestinal: Negative.   Endocrine: Negative.   Genitourinary: Negative.   Musculoskeletal: Negative.   Skin: Negative.   Allergic/Immunologic: Negative.   Neurological: Negative.  Negative for dizziness and headaches.  Hematological: Negative.   Psychiatric/Behavioral: Negative.      Today's Vitals   12/22/17 1504  BP: 128/80  Pulse: 60  Temp: 97.6 F (36.4 C)  SpO2: 98%  Weight: 181 lb 12.8 oz (82.5 kg)  Height: 5' 8.5" (1.74 m)  PainSc: 0-No pain   Body mass index is 27.24 kg/m.   Objective:  Physical Exam  Constitutional: He is oriented to person, place, and time. He appears well-developed and well-nourished.  Eyes: Pupils are equal, round, and reactive to light. Conjunctivae and EOM are normal.  Neck: Normal range of motion. Neck supple.  Cardiovascular: Normal rate, regular rhythm and normal heart sounds.  Pulmonary/Chest: Effort normal and breath sounds normal.   Abdominal: Soft. Bowel sounds are normal.  Musculoskeletal: Normal range of motion.  Neurological: He is alert and oriented to person, place, and time.  Skin: Skin is warm and dry. Capillary refill takes less than 2 seconds. No erythema.  Psychiatric: He has a normal mood and affect.        Assessment And Plan:   1. Essential hypertension  Chronic, controlled  Continue with current medications  His annual wellness exam was done today - CBC no Diff - BMP8+eGFR - Lipid Profile - POCT Urinalysis Dipstick (81002) - POCT UA - Microalbumin  2. Encounter for prostate cancer screening   3. Encounter for hepatitis C screening test for low risk patient  He falls in the high risk age range, will screen for Hepatitis  - Hepatitis C antibody   Arnette Felts, FNP

## 2017-12-22 NOTE — Addendum Note (Signed)
Addended by: Arnette FeltsMOORE, Olive Motyka F on: 12/22/2017 06:39 PM   Modules accepted: Orders

## 2017-12-22 NOTE — Progress Notes (Signed)
Subjective:   Alan [name] is a 67 y.o. male who presents for Medicare Annual/Subsequent preventive examination.  Review of Systems:  n/a Cardiac Risk Factors include: advanced age (>72men, >18 women);male gender     Objective:    Vitals: BP 120/80 (BP Location: Right Arm)   Pulse 60   Temp 97.6 F (36.4 C)   Ht 5' 8.5" (1.74 m)   Wt 181 lb (82.1 kg)   BMI 27.12 kg/m   Body mass index is 27.12 kg/m.  Advanced Directives 12/22/2017 08/01/2017  Does Patient Have a Medical Advance Directive? No No  Would patient like information on creating a medical advance directive? Yes (MAU/Ambulatory/Procedural Areas - Information given) No - Patient declined    Tobacco Social History   Tobacco Use  Smoking Status Never Smoker  Smokeless Tobacco Never Used     Counseling given: Not Answered   Clinical Intake:  Pre-visit preparation completed: Yes  Pain : No/denies pain Pain Score: 0-No pain     Nutritional Status: BMI 25 -29 Overweight Diabetes: No  How often do you need to have someone help you when you read instructions, pamphlets, or other written materials from your doctor or pharmacy?: 1 - Never What is the last grade level you completed in school?: 2 years college  Interpreter Needed?: No  Information entered by :: NAllen LPN  Past Medical History:  Diagnosis Date  . Cataracts, bilateral   . Glaucoma   . Renal disorder    kideny stone   History reviewed. No pertinent surgical history. History reviewed. No pertinent family history. Social History   Socioeconomic History  . Marital status: Married    Spouse name: Not on file  . Number of children: Not on file  . Years of education: Not on file  . Highest education level: Not on file  Occupational History  . Not on file  Social Needs  . Financial resource strain: Not hard at all  . Food insecurity:    Worry: Never true    Inability: Never true  . Transportation needs:    Medical: No   Non-medical: No  Tobacco Use  . Smoking status: Never Smoker  . Smokeless tobacco: Never Used  Substance and Sexual Activity  . Alcohol use: Never    Frequency: Never  . Drug use: Never  . Sexual activity: Yes  Lifestyle  . Physical activity:    Days per week: 0 days    Minutes per session: 0 min  . Stress: Not at all  Relationships  . Social connections:    Talks on phone: Not on file    Gets together: Not on file    Attends religious service: Not on file    Active member of club or organization: Not on file    Attends meetings of clubs or organizations: Not on file    Relationship status: Not on file  Other Topics Concern  . Not on file  Social History Narrative  . Not on file    Outpatient Encounter Medications as of 12/22/2017  Medication Sig  . amLODipine (NORVASC) 5 MG tablet Take 5 mg by mouth daily.  . Timolol Maleate (ISTALOL) 0.5 % (DAILY) SOLN   . Travoprost, BAK Free, (TRAVATAN Z) 0.004 % SOLN ophthalmic solution Place 1 drop into both eyes at bedtime.   No facility-administered encounter medications on file as of 12/22/2017.     Activities of Daily Living In your present state of health, do you have any difficulty  performing the following activities: 12/22/2017  Hearing? N  Vision? N  Difficulty concentrating or making decisions? N  Walking or climbing stairs? N  Dressing or bathing? N  Doing errands, shopping? N  Preparing Food and eating ? N  Using the Toilet? N  In the past six months, have you accidently leaked urine? N  Do you have problems with loss of bowel control? N  Managing your Medications? N  Managing your Finances? N  Housekeeping or managing your Housekeeping? N  Some recent data might be hidden    Patient Care Team: Dorothyann Peng, MD as PCP - General (Internal Medicine)   Assessment:   This is a routine wellness examination for [name].  Exercise Activities and Dietary recommendations Current Exercise Habits: The patient has a  physically strenous job, but has no regular exercise apart from work., Exercise limited by: None identified  Goals   None     Fall Risk Fall Risk  12/22/2017 12/22/2017 09/10/2017  Falls in the past year? 0 0 No  Comment - - Emmi Telephone Survey: data to providers prior to load  Risk for fall due to : Medication side effect - -   Is the patient's home free of loose throw rugs in walkways, pet beds, electrical cords, etc?   yes      Grab bars in the bathroom? no      Handrails on the stairs?   yes      Adequate lighting?   yes  Timed Get Up and Go Performed: n/a  Depression Screen PHQ 2/9 Scores 12/22/2017 12/22/2017  PHQ - 2 Score 0 0    Cognitive Function     6CIT Screen 12/22/2017  What Year? 0 points  What month? 0 points  What time? 0 points  Count back from 20 0 points  Months in reverse 0 points  Repeat phrase 0 points  Total Score 0    Immunization History  Administered Date(s) Administered  . Influenza-Unspecified 11/22/2017    Qualifies for Shingles Vaccine? yes  Screening Tests Health Maintenance  Topic Date Due  . Hepatitis C Screening  [birth year]  . PNA vac Low Risk Adult (1 of 2 - PCV13) 06/21/2015  . TETANUS/TDAP  11/25/2022  . COLONOSCOPY  08/19/2023  . INFLUENZA VACCINE  Completed   Cancer Screenings: Lung: Low Dose CT Chest recommended if Age 84-80 years, 30 pack-year currently smoking OR have quit w/in 15years. Patient does not qualify. Colorectal: up to date  Additional Screenings:  Hepatitis C Screening:due      Plan:    Patient is checking to see if he has had pneumonia vaccines with the [place].  I have personally reviewed and noted the following in the patient's chart:   . Medical and social history . Use of alcohol, tobacco or illicit drugs  . Current medications and supplements . Functional ability and status . Nutritional status . Physical activity . Advanced directives . List of other physicians . Hospitalizations,  surgeries, and ER visits in previous 12 months . Vitals . Screenings to include cognitive, depression, and falls . Referrals and appointments  In addition, I have reviewed and discussed with patient certain preventive protocols, quality metrics, and best practice recommendations. A written personalized care plan for preventive services as well as general preventive health recommendations were provided to patient.     Barb Merino, LPN  16/11/9602

## 2017-12-23 LAB — BMP8+EGFR
BUN/Creatinine Ratio: 12 (ref 10–24)
BUN: 13 mg/dL (ref 8–27)
CALCIUM: 9.2 mg/dL (ref 8.6–10.2)
CHLORIDE: 105 mmol/L (ref 96–106)
CO2: 26 mmol/L (ref 20–29)
Creatinine, Ser: 1.07 mg/dL (ref 0.76–1.27)
GFR calc non Af Amer: 71 mL/min/{1.73_m2} (ref >59)
GFR, EST AFRICAN AMERICAN: 83 mL/min/{1.73_m2} (ref >59)
Glucose: 78 mg/dL (ref 65–99)
POTASSIUM: 4.2 mmol/L (ref 3.5–5.2)
Sodium: 142 mmol/L (ref 134–144)

## 2017-12-23 LAB — CBC
Hematocrit: 42.4 % (ref 37.5–51.0)
Hemoglobin: 15.1 g/dL (ref 13.0–17.7)
MCH: 31.5 pg (ref 26.6–33.0)
MCHC: 35.6 g/dL (ref 31.5–35.7)
MCV: 89 fL (ref 79–97)
PLATELETS: 127 10*3/uL — AB (ref 150–450)
RBC: 4.79 x10E6/uL (ref 4.14–5.80)
RDW: 13.4 % (ref 12.3–15.4)
WBC: 4.6 10*3/uL (ref 3.4–10.8)

## 2017-12-23 LAB — LIPID PANEL
Chol/HDL Ratio: 3.8 ratio (ref 0.0–5.0)
Cholesterol, Total: 176 mg/dL (ref 100–199)
HDL: 46 mg/dL (ref >39)
LDL Calculated: 119 mg/dL — ABNORMAL HIGH (ref 0–99)
Triglycerides: 57 mg/dL (ref 0–149)
VLDL Cholesterol Cal: 11 mg/dL (ref 5–40)

## 2017-12-23 LAB — HEPATITIS C ANTIBODY: Hep C Virus Ab: <0.1 s/co ratio (ref 0.0–0.9)

## 2017-12-27 ENCOUNTER — Telehealth: Payer: Self-pay | Admitting: Nurse Practitioner

## 2017-12-27 NOTE — Telephone Encounter (Addendum)
  We can refer him to a specialist if he is interested?  Is he taking an aspirin daily as well    ----- Message from [facility] Roman [place], [place] sent at 12/23/2017  4:33 PM EST ----- Patient notified he stated he has seen some random bruising in the last 5 months. YRL,RMA

## 2018-01-13 ENCOUNTER — Other Ambulatory Visit: Payer: Self-pay | Admitting: Nurse Practitioner

## 2018-01-28 NOTE — Telephone Encounter (Signed)
Patient stated he is not interested in seeing a specialist at this particular moment is it really necessary that he see one? And he also stated he is not currently taking aspirin and you recommended him to take vitamin b but didn't specify which one? YRL,RMA

## 2018-02-01 NOTE — Telephone Encounter (Signed)
A B complex vitamin is fine, lowest dose available. Okay about the aspirin

## 2018-02-04 NOTE — Telephone Encounter (Signed)
Advised pt that we will check his bloodwork at his next appointment and if Arnette FeltsJanece Moore FNP-BC feels he needs the referral she will refer him then. YRL,RMA

## 2018-02-04 NOTE — Telephone Encounter (Signed)
Left pt v/m to call office. YRL,RMA 

## 2018-03-04 DIAGNOSIS — H25811 Combined forms of age-related cataract, right eye: Secondary | ICD-10-CM | POA: Diagnosis not present

## 2018-03-04 DIAGNOSIS — H5310 Unspecified subjective visual disturbances: Secondary | ICD-10-CM | POA: Diagnosis not present

## 2018-03-04 DIAGNOSIS — H25812 Combined forms of age-related cataract, left eye: Secondary | ICD-10-CM | POA: Diagnosis not present

## 2018-03-04 DIAGNOSIS — H40003 Preglaucoma, unspecified, bilateral: Secondary | ICD-10-CM | POA: Diagnosis not present

## 2018-03-24 ENCOUNTER — Ambulatory Visit (INDEPENDENT_AMBULATORY_CARE_PROVIDER_SITE_OTHER): Payer: Medicare Other | Admitting: Nurse Practitioner

## 2018-03-24 ENCOUNTER — Encounter: Payer: Self-pay | Admitting: Nurse Practitioner

## 2018-03-24 VITALS — BP 110/70 | HR 67 | Temp 97.7°F | Ht 68.8 in | Wt 188.2 lb

## 2018-03-24 DIAGNOSIS — I10 Essential (primary) hypertension: Secondary | ICD-10-CM

## 2018-03-24 NOTE — Progress Notes (Signed)
Subjective:     Patient ID: Alan [name] , male    DOB: 03-Jan-1951 , 68 y.o.   MRN: [id number]   Chief Complaint  Patient presents with  . Hypertension    HPI  Hypertension  This is a chronic problem. The current episode started more than 1 year ago. The problem is unchanged. The problem is controlled. Pertinent negatives include no anxiety, chest pain, headaches or palpitations. There are no associated agents to hypertension. Risk factors for coronary artery disease include male gender. Past treatments include calcium channel blockers. There are no compliance problems.  There is no history of angina. There is no history of chronic renal disease.     Past Medical History:  Diagnosis Date  . Cataracts, bilateral   . Glaucoma   . Renal disorder    kideny stone     No family history on file.   Current Outpatient Medications:  .  amLODipine (NORVASC) 5 MG tablet, TAKE 1 TABLET DAILY, Disp: 90 tablet, Rfl: 1 .  Timolol Maleate (ISTALOL) 0.5 % (DAILY) SOLN, , Disp: , Rfl:  .  Travoprost, BAK Free, (TRAVATAN Z) 0.004 % SOLN ophthalmic solution, Place 1 drop into both eyes at bedtime., Disp: , Rfl:    No Known Allergies   Review of Systems  Constitutional: Negative for fatigue.  Respiratory: Negative.   Cardiovascular: Negative.  Negative for chest pain, palpitations and leg swelling.       Occasional leg swelling 3-4 times per year.    Skin: Negative.  Negative for rash.  Neurological: Positive for dizziness (intermittently when bending and at rest). Negative for headaches.     Today's Vitals   03/24/18 1539  BP: 110/70  Pulse: 67  Temp: 97.7 F (36.5 C)  TempSrc: Oral  SpO2: 94%  Weight: 188 lb 3.2 oz (85.4 kg)  Height: 5' 8.8" (1.748 m)  PainSc: 0-No pain   Body mass index is 27.95 kg/m.   Objective:  Physical Exam Vitals signs reviewed.  Constitutional:      Appearance: Normal appearance.  Cardiovascular:     Rate and Rhythm: Normal rate and regular  rhythm.     Pulses: Normal pulses.     Heart sounds: No murmur.  Pulmonary:     Effort: Pulmonary effort is normal. No respiratory distress.     Breath sounds: Normal breath sounds. No wheezing.  Skin:    General: Skin is warm and dry.     Capillary Refill: Capillary refill takes less than 2 seconds.  Neurological:     General: No focal deficit present.     Mental Status: He is alert and oriented to person, place, and time.  Psychiatric:        Mood and Affect: Mood normal.         Assessment And Plan:     1. Essential hypertension . B/P is well controlled.  Marland Kitchen BMP ordered to check renal function.  . Reports intermittent  . The importance of regular exercise and dietary modification was stressed to the patient.     Arnette Felts, FNP

## 2018-03-25 LAB — CBC WITH DIFFERENTIAL/PLATELET
BASOS: 1 %
Basophils Absolute: 0.1 10*3/uL (ref 0.0–0.2)
EOS (ABSOLUTE): 0.1 10*3/uL (ref 0.0–0.4)
Eos: 2 %
HEMATOCRIT: 42.5 % (ref 37.5–51.0)
HEMOGLOBIN: 15.3 g/dL (ref 13.0–17.7)
IMMATURE GRANS (ABS): 0 10*3/uL (ref 0.0–0.1)
IMMATURE GRANULOCYTES: 0 %
LYMPHS: 38 %
Lymphocytes Absolute: 2.3 10*3/uL (ref 0.7–3.1)
MCH: 31.5 pg (ref 26.6–33.0)
MCHC: 36 g/dL — ABNORMAL HIGH (ref 31.5–35.7)
MCV: 87 fL (ref 79–97)
MONOS ABS: 0.7 10*3/uL (ref 0.1–0.9)
Monocytes: 11 %
NEUTROS PCT: 48 %
Neutrophils Absolute: 2.9 10*3/uL (ref 1.4–7.0)
Platelets: 141 10*3/uL — ABNORMAL LOW (ref 150–450)
RBC: 4.86 x10E6/uL (ref 4.14–5.80)
RDW: 13.1 % (ref 11.6–15.4)
WBC: 6 10*3/uL (ref 3.4–10.8)

## 2018-03-25 LAB — BMP8+EGFR
BUN/Creatinine Ratio: 12 (ref 10–24)
BUN: 14 mg/dL (ref 8–27)
CO2: 24 mmol/L (ref 20–29)
CREATININE: 1.16 mg/dL (ref 0.76–1.27)
Calcium: 10 mg/dL (ref 8.6–10.2)
Chloride: 104 mmol/L (ref 96–106)
GFR calc Af Amer: 75 mL/min/{1.73_m2} (ref >59)
GFR, EST NON AFRICAN AMERICAN: 65 mL/min/{1.73_m2} (ref >59)
Glucose: 103 mg/dL — ABNORMAL HIGH (ref 65–99)
Potassium: 4.6 mmol/L (ref 3.5–5.2)
Sodium: 142 mmol/L (ref 134–144)

## 2018-04-01 ENCOUNTER — Telehealth: Payer: Self-pay

## 2018-04-01 NOTE — Telephone Encounter (Signed)
Results given.

## 2018-04-01 NOTE — Telephone Encounter (Signed)
Left message needs lab results

## 2018-06-27 ENCOUNTER — Ambulatory Visit: Payer: Self-pay

## 2018-06-27 ENCOUNTER — Telehealth: Payer: Self-pay

## 2018-06-27 DIAGNOSIS — I10 Essential (primary) hypertension: Secondary | ICD-10-CM

## 2018-06-27 NOTE — Telephone Encounter (Signed)
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 YRL,RMA   RETURNED PT CALL AND ADVISED HIM HE CAN GO TO [facility] IN [place] AND WE WILL WRITE HIM A LETTER. PT NOTIFIED TO GIVE [place] A CALL WHEN HE IS COMING SO WE CAN BRING THE LETTER OUTSIDE TO HIM. Alan [name]

## 2018-06-27 NOTE — Patient Instructions (Signed)
Social Worker Visit Information   Mr. Blanda was given information about Chronic Care Management services today including:  1. CCM service includes personalized support from designated clinical staff supervised by his physician, including individualized plan of care and coordination with other care providers 2. 24/7 contact phone numbers for assistance for urgent and routine care needs. 3. Service will only be billed when office clinical staff spend 20 minutes or more in a month to coordinate care. 4. Only one practitioner may furnish and bill the service in a calendar month. 5. The patient may stop CCM services at any time (effective at the end of the month) by phone call to the office staff. 6. The patient will be responsible for cost sharing (co-pay) of up to 20% of the service fee (after annual deductible is met).  Patient did not agree to services and does not wish to consider at this time.  The patient verbalized understanding of instructions provided today and declined a print copy of patient instruction materials.   Follow up plan: Client will speak with primary provider for future referral to CCM program if/when needed   Nadara Eaton, CDP [facility] / [facility] Care Management Social Worker [phone]

## 2018-06-27 NOTE — Chronic Care Management (AMB) (Signed)
Chronic Care Management   Telephone Outreach Note  06/27/2018 Name: Alan [name] MRN: [id number] DOB: [birth year]  Referred by: patient's health plan.   I reached out to [name] today by phone in response to a referral sent by [name]'s health plan. [name] and I briefly discussed care management needs related to HTN.  [name] was given information about Chronic Care Management services today including:  1. CCM service includes personalized support from designated clinical staff supervised by his physician, including individualized plan of care and coordination with other care providers 2. 24/7 contact phone numbers for assistance for urgent and routine care needs. 3. Service will only be billed when office clinical staff spend 20 minutes or more in a month to coordinate care. 4. Only one practitioner may furnish and bill the service in a calendar month. 5. The patient may stop CCM services at any time (effective at the end of the month) by phone call to the office staff. 6. The patient will be responsible for cost sharing (co-pay) of up to 20% of the service fee (after annual deductible is met).  Patient did not agree to services and does not wish to consider at this time.  No further follow up required: The patient reports his medical conditions are stable at the time of today's call. [name] will outreach his provider in the future for a CCM referral if/when needed.   Arnette Felts FNP has been notified of this outreach and Mr. Alan Fielding Alan [name]'s decision and plan.   Bevelyn Ngo, BSW, CDP [facility] / [facility] Care Management Social Worker [phone]  Total time spent performing care coordination and/or care management activities with the patient by phone or face to face = 5 minutes.

## 2018-06-29 ENCOUNTER — Other Ambulatory Visit: Payer: Self-pay | Admitting: Nurse Practitioner

## 2018-06-29 DIAGNOSIS — Z03818 Encounter for observation for suspected exposure to other biological agents ruled out: Secondary | ICD-10-CM | POA: Diagnosis not present

## 2018-07-12 DIAGNOSIS — Z03818 Encounter for observation for suspected exposure to other biological agents ruled out: Secondary | ICD-10-CM | POA: Diagnosis not present

## 2018-07-18 DIAGNOSIS — Z20828 Contact with and (suspected) exposure to other viral communicable diseases: Secondary | ICD-10-CM | POA: Diagnosis not present

## 2018-07-28 ENCOUNTER — Ambulatory Visit: Payer: Medicare Other | Admitting: Nurse Practitioner

## 2018-08-04 DIAGNOSIS — Z800 Family history of malignant neoplasm of digestive organs: Secondary | ICD-10-CM | POA: Diagnosis not present

## 2018-08-04 DIAGNOSIS — Z1211 Encounter for screening for malignant neoplasm of colon: Secondary | ICD-10-CM | POA: Diagnosis not present

## 2018-08-18 ENCOUNTER — Other Ambulatory Visit: Payer: Self-pay

## 2018-08-18 ENCOUNTER — Other Ambulatory Visit: Payer: Self-pay | Admitting: Nurse Practitioner

## 2018-08-18 MED ORDER — VALACYCLOVIR HCL 500 MG PO TABS: 500.0000 mg | ORAL_TABLET | Freq: Two times a day (BID) | ORAL | 0 refills | Status: AC

## 2018-08-22 DIAGNOSIS — Z20828 Contact with and (suspected) exposure to other viral communicable diseases: Secondary | ICD-10-CM | POA: Diagnosis not present

## 2018-09-07 DIAGNOSIS — H25811 Combined forms of age-related cataract, right eye: Secondary | ICD-10-CM | POA: Diagnosis not present

## 2018-09-07 DIAGNOSIS — H40003 Preglaucoma, unspecified, bilateral: Secondary | ICD-10-CM | POA: Diagnosis not present

## 2018-09-07 DIAGNOSIS — H25812 Combined forms of age-related cataract, left eye: Secondary | ICD-10-CM | POA: Diagnosis not present

## 2018-09-16 DIAGNOSIS — Z86010 Personal history of colonic polyps: Secondary | ICD-10-CM | POA: Diagnosis not present

## 2018-09-16 DIAGNOSIS — Z800 Family history of malignant neoplasm of digestive organs: Secondary | ICD-10-CM | POA: Diagnosis not present

## 2018-09-16 DIAGNOSIS — Z1211 Encounter for screening for malignant neoplasm of colon: Secondary | ICD-10-CM | POA: Diagnosis not present

## 2018-09-16 LAB — HM COLONOSCOPY

## 2018-09-19 ENCOUNTER — Encounter: Payer: Self-pay | Admitting: Nurse Practitioner

## 2018-10-19 ENCOUNTER — Telehealth: Payer: Self-pay

## 2018-10-19 ENCOUNTER — Other Ambulatory Visit: Payer: Self-pay

## 2018-10-19 ENCOUNTER — Other Ambulatory Visit: Payer: Self-pay | Admitting: Nurse Practitioner

## 2018-10-19 MED ORDER — VALACYCLOVIR HCL 500 MG PO TABS: 500.0000 mg | ORAL_TABLET | Freq: Two times a day (BID) | ORAL | 0 refills | Status: DC

## 2018-10-19 NOTE — Progress Notes (Signed)
VALTREX

## 2018-10-19 NOTE — Telephone Encounter (Signed)
Patient called requesting a refill on valtrex to be sent to the pharmacy he stated he has a cold sore on his lip that started a day or so.  I RETURNED PT CALL AND LEFT HIM A V/M NOTIFYING HIM THAT WE HAVE SENT THE RX. YRL,RMA

## 2018-10-19 NOTE — Telephone Encounter (Signed)
Pt called the office regarding my message I left earlier about rescheduling his missed appt. He does not wish to reschedule his appt at this time, he would like to just be seen at his Nov 2020 appt. He stated he has been monitoring his blood pressure and it has been good.

## 2018-10-19 NOTE — Telephone Encounter (Signed)
LVM for pt to call the office to reschedule missed appt.

## 2018-11-23 ENCOUNTER — Other Ambulatory Visit: Payer: Self-pay

## 2018-11-23 DIAGNOSIS — Z20822 Contact with and (suspected) exposure to covid-19: Secondary | ICD-10-CM

## 2018-11-23 DIAGNOSIS — Z20828 Contact with and (suspected) exposure to other viral communicable diseases: Secondary | ICD-10-CM | POA: Diagnosis not present

## 2018-11-24 LAB — NOVEL CORONAVIRUS, NAA: SARS-CoV-2, NAA: NOT DETECTED

## 2018-11-30 ENCOUNTER — Telehealth: Payer: Self-pay | Admitting: Internal Medicine

## 2018-11-30 NOTE — Telephone Encounter (Signed)
Negative COVID results given. Patient results "NOT Detected." Caller expressed understanding. ° °

## 2018-12-19 ENCOUNTER — Other Ambulatory Visit: Payer: Self-pay

## 2018-12-19 DIAGNOSIS — Z20822 Contact with and (suspected) exposure to covid-19: Secondary | ICD-10-CM

## 2018-12-22 LAB — NOVEL CORONAVIRUS, NAA: SARS-CoV-2, NAA: NOT DETECTED

## 2018-12-26 ENCOUNTER — Other Ambulatory Visit: Payer: Self-pay | Admitting: Nurse Practitioner

## 2018-12-29 ENCOUNTER — Ambulatory Visit: Payer: Medicare Other | Admitting: Nurse Practitioner

## 2018-12-29 ENCOUNTER — Ambulatory Visit: Payer: Medicare Other

## 2019-01-19 ENCOUNTER — Ambulatory Visit (INDEPENDENT_AMBULATORY_CARE_PROVIDER_SITE_OTHER): Payer: Medicare Other

## 2019-01-19 ENCOUNTER — Encounter: Payer: Self-pay | Admitting: Nurse Practitioner

## 2019-01-19 ENCOUNTER — Ambulatory Visit (INDEPENDENT_AMBULATORY_CARE_PROVIDER_SITE_OTHER): Payer: Medicare Other | Admitting: Nurse Practitioner

## 2019-01-19 ENCOUNTER — Other Ambulatory Visit: Payer: Self-pay

## 2019-01-19 VITALS — BP 132/78 | HR 72 | Temp 98.2°F | Ht 68.8 in | Wt 185.4 lb

## 2019-01-19 DIAGNOSIS — I10 Essential (primary) hypertension: Secondary | ICD-10-CM

## 2019-01-19 DIAGNOSIS — E7800 Pure hypercholesterolemia, unspecified: Secondary | ICD-10-CM

## 2019-01-19 DIAGNOSIS — Z0000 Encounter for general adult medical examination without abnormal findings: Secondary | ICD-10-CM | POA: Diagnosis not present

## 2019-01-19 LAB — POCT URINALYSIS DIPSTICK
Bilirubin, UA: NEGATIVE
Blood, UA: NEGATIVE
Glucose, UA: NEGATIVE
Ketones, UA: NEGATIVE
Leukocytes, UA: NEGATIVE
Nitrite, UA: NEGATIVE
Protein, UA: NEGATIVE
Spec Grav, UA: 1.025 (ref 1.010–1.025)
Urobilinogen, UA: 1 E.U./dL
pH, UA: 7 (ref 5.0–8.0)

## 2019-01-19 LAB — POCT UA - MICROALBUMIN
Albumin/Creatinine Ratio, Urine, POC: <30
Creatinine, POC: 200 mg/dL
Microalbumin Ur, POC: 30 mg/L

## 2019-01-19 NOTE — Progress Notes (Signed)
This visit occurred during the SARS-CoV-2 public health emergency.  Safety protocols were in place, including screening questions prior to the visit, additional usage of staff PPE, and extensive cleaning of exam room while observing appropriate contact time as indicated for disinfecting solutions.  Subjective:   Alan [name] is a 68 y.o. male who presents for Medicare Annual/Subsequent preventive examination.  Review of Systems:  n/a Cardiac Risk Factors include: advanced age (>28men, >34 women);hypertension;male gender;sedentary lifestyle     Objective:    Vitals: BP 132/78 (BP Location: Left Arm, Patient Position: Sitting, Cuff Size: Normal)   Pulse 72   Temp 98.2 F (36.8 C) (Oral)   Ht 5' 8.8" (1.748 m)   Wt 185 lb 6.4 oz (84.1 kg)   SpO2 98%   BMI 27.54 kg/m   Body mass index is 27.54 kg/m.  Advanced Directives 01/19/2019 12/22/2017 08/01/2017  Does Patient Have a Medical Advance Directive? No No No  Would patient like information on creating a medical advance directive? Yes (MAU/Ambulatory/Procedural Areas - Information given) Yes (MAU/Ambulatory/Procedural Areas - Information given) No - Patient declined    Tobacco Social History   Tobacco Use  Smoking Status Never Smoker  Smokeless Tobacco Never Used     Counseling given: Not Answered   Clinical Intake:  Pre-visit preparation completed: Yes  Pain : No/denies pain     Nutritional Status: BMI 25 -29 Overweight Nutritional Risks: None Diabetes: No  How often do you need to have someone help you when you read instructions, pamphlets, or other written materials from your doctor or pharmacy?: 1 - Never What is the last grade level you completed in school?: 2 years college  Interpreter Needed?: No  Information entered by :: NAllen LPN  Past Medical History:  Diagnosis Date  . Cataracts, bilateral   . Glaucoma   . Renal disorder    kideny stone   History reviewed. No pertinent surgical  history. History reviewed. No pertinent family history. Social History   Socioeconomic History  . Marital status: Married    Spouse name: Not on file  . Number of children: Not on file  . Years of education: Not on file  . Highest education level: Not on file  Occupational History  . Not on file  Tobacco Use  . Smoking status: Never Smoker  . Smokeless tobacco: Never Used  Substance and Sexual Activity  . Alcohol use: Yes    Comment: socially  . Drug use: Never  . Sexual activity: Yes  Other Topics Concern  . Not on file  Social History Narrative  . Not on file   Social Determinants of Health   Financial Resource Strain: Low Risk   . Difficulty of Paying Living Expenses: Not hard at all  Food Insecurity: No Food Insecurity  . Worried About Charity fundraiser in the Last Year: Never true  . Ran Out of Food in the Last Year: Never true  Transportation Needs: No Transportation Needs  . Lack of Transportation (Medical): No  . Lack of Transportation (Non-Medical): No  Physical Activity: Inactive  . Days of Exercise per Week: 0 days  . Minutes of Exercise per Session: 0 min  Stress: No Stress Concern Present  . Feeling of Stress : Not at all  Social Connections:   . Frequency of Communication with Friends and Family: Not on file  . Frequency of Social Gatherings with Friends and Family: Not on file  . Attends Religious Services: Not on file  .  Active Member of Clubs or Organizations: Not on file  . Attends Banker Meetings: Not on file  . Marital Status: Not on file    Outpatient Encounter Medications as of 01/19/2019  Medication Sig  . amLODipine (NORVASC) 5 MG tablet TAKE 1 TABLET DAILY  . Timolol Maleate (ISTALOL) 0.5 % (DAILY) SOLN   . Travoprost, BAK Free, (TRAVATAN Z) 0.004 % SOLN ophthalmic solution Place 1 drop into both eyes at bedtime.  . valACYclovir (VALTREX) 500 MG tablet Take 1 tablet (500 mg total) by mouth 2 (two) times daily.   No  facility-administered encounter medications on file as of 01/19/2019.    Activities of Daily Living In your present state of health, do you have any difficulty performing the following activities: 01/19/2019  Hearing? N  Vision? N  Difficulty concentrating or making decisions? N  Walking or climbing stairs? N  Dressing or bathing? N  Doing errands, shopping? N  Preparing Food and eating ? N  Using the Toilet? N  In the past six months, have you accidently leaked urine? N  Do you have problems with loss of bowel control? N  Managing your Medications? N  Managing your Finances? N  Housekeeping or managing your Housekeeping? N  Some recent data might be hidden    Patient Care Team: Dorothyann Peng, MD as PCP - General (Internal Medicine) Arnette Felts, FNP (General Practice)   Assessment:   This is a routine wellness examination for Alan [name].  Exercise Activities and Dietary recommendations Current Exercise Habits: The patient does not participate in regular exercise at present  Goals    . Patient Stated     01/19/2019, stay healthy       Fall Risk Fall Risk  01/19/2019 03/24/2018 12/22/2017 12/22/2017 09/10/2017  Falls in the past year? 0 0 0 0 No  Comment - - - - Emmi Telephone Survey: data to providers prior to load  Risk for fall due to : Medication side effect - Medication side effect - -  Follow up Falls evaluation completed;Education provided;Falls prevention discussed - - - -   Is the patient's home free of loose throw rugs in walkways, pet beds, electrical cords, etc?   yes      Grab bars in the bathroom? no      Handrails on the stairs?   n/a      Adequate lighting?   yes  Timed Get Up and Go Performed: n/a  Depression Screen PHQ 2/9 Scores 01/19/2019 03/24/2018 12/22/2017 12/22/2017  PHQ - 2 Score 0 0 0 0  PHQ- 9 Score 0 - - -    Cognitive Function     6CIT Screen 01/19/2019 12/22/2017  What Year? 0 points 0 points  What month? 0 points 0 points  What  time? 0 points 0 points  Count back from 20 0 points 0 points  Months in reverse 0 points 0 points  Repeat phrase 0 points 0 points  Total Score 0 0    Immunization History  Administered Date(s) Administered  . Influenza, High Dose Seasonal PF 11/24/2018  . Influenza-Unspecified 11/22/2017    Qualifies for Shingles Vaccine? yes  Screening Tests Health Maintenance  Topic Date Due  . PNA vac Low Risk Adult (1 of 2 - PCV13) 06/21/2015  . TETANUS/TDAP  11/25/2022  . COLONOSCOPY  09/15/2028  . INFLUENZA VACCINE  Completed  . Hepatitis C Screening  Completed   Cancer Screenings: Lung: Low Dose CT Chest recommended if Age 58-80 years,  30 pack-year currently smoking OR have quit w/in 15years. Patient does not qualify. Colorectal: up to date  Additional Screenings:  Hepatitis C Screening:12/22/2017      Plan:    Patient wants to stay healthy  I have personally reviewed and noted the following in the patient's chart:   . Medical and social history . Use of alcohol, tobacco or illicit drugs  . Current medications and supplements . Functional ability and status . Nutritional status . Physical activity . Advanced directives . List of other physicians . Hospitalizations, surgeries, and ER visits in previous 12 months . Vitals . Screenings to include cognitive, depression, and falls . Referrals and appointments  In addition, I have reviewed and discussed with patient certain preventive protocols, quality metrics, and best practice recommendations. A written personalized care plan for preventive services as well as general preventive health recommendations were provided to patient.     Barb Merinoickeah E Katarina Riebe, LPN  16/10/960412/11/2018

## 2019-01-19 NOTE — Patient Instructions (Signed)
Alan [name] , Thank you for taking time to come for your Medicare Wellness Visit. I appreciate your ongoing commitment to your health goals. Please review the following plan we discussed and let me know if I can assist you in the future.   Screening recommendations/referrals: Colonoscopy: 09/2018 Recommended yearly ophthalmology/optometry visit for glaucoma screening and checkup Recommended yearly dental visit for hygiene and checkup  Vaccinations: Influenza vaccine: 11/2018 Pneumococcal vaccine: patient following up Tdap vaccine: 11/2012 Shingles vaccine: discussed    Advanced directives: Advance directive discussed with you today. I have provided a copy for you to complete at home and have notarized. Once this is complete please bring a copy in to our office so we can scan it into your chart.   Conditions/risks identified: overweight  Next appointment: 01/25/2020 at 2:00  Preventive Care 5 Years and Older, Male Preventive care refers to lifestyle choices and visits with your health care provider that can promote health and wellness. What does preventive care include?  A yearly physical exam. This is also called an annual well check.  Dental exams once or twice a year.  Routine eye exams. Ask your health care provider how often you should have your eyes checked.  Personal lifestyle choices, including:  Daily care of your teeth and gums.  Regular physical activity.  Eating a healthy diet.  Avoiding tobacco and drug use.  Limiting alcohol use.  Practicing safe sex.  Taking low doses of aspirin every day.  Taking vitamin and mineral supplements as recommended by your health care provider. What happens during an annual well check? The services and screenings done by your health care provider during your annual well check will depend on your age, overall health, lifestyle risk factors, and family history of disease. Counseling  Your health care provider may ask you  questions about your:  Alcohol use.  Tobacco use.  Drug use.  Emotional well-being.  Home and relationship well-being.  Sexual activity.  Eating habits.  History of falls.  Memory and ability to understand (cognition).  Work and work Statistician. Screening  You may have the following tests or measurements:  Height, weight, and BMI.  Blood pressure.  Lipid and cholesterol levels. These may be checked every 5 years, or more frequently if you are over 37 years old.  Skin check.  Lung cancer screening. You may have this screening every year starting at age 54 if you have a 30-pack-year history of smoking and currently smoke or have quit within the past 15 years.  Fecal occult blood test (FOBT) of the stool. You may have this test every year starting at age 12.  Flexible sigmoidoscopy or colonoscopy. You may have a sigmoidoscopy every 5 years or a colonoscopy every 10 years starting at age 53.  Prostate cancer screening. Recommendations will vary depending on your family history and other risks.  Hepatitis C blood test.  Hepatitis B blood test.  Sexually transmitted disease (STD) testing.  Diabetes screening. This is done by checking your blood sugar (glucose) after you have not eaten for a while (fasting). You may have this done every 1-3 years.  Abdominal aortic aneurysm (AAA) screening. You may need this if you are a current or former smoker.  Osteoporosis. You may be screened starting at age 47 if you are at high risk. Talk with your health care provider about your test results, treatment options, and if necessary, the need for more tests. Vaccines  Your health care provider may recommend certain vaccines, such as:  Influenza vaccine. This is recommended every year.  Tetanus, diphtheria, and acellular pertussis (Tdap, Td) vaccine. You may need a Td booster every 10 years.  Zoster vaccine. You may need this after age 19.  Pneumococcal 13-valent conjugate  (PCV13) vaccine. One dose is recommended after age 33.  Pneumococcal polysaccharide (PPSV23) vaccine. One dose is recommended after age 84. Talk to your health care provider about which screenings and vaccines you need and how often you need them. This information is not intended to replace advice given to you by your health care provider. Make sure you discuss any questions you have with your health care provider. Document Released: 02/22/2015 Document Revised: 10/16/2015 Document Reviewed: 11/27/2014 Elsevier Interactive Patient Education  2017 [employer] Prevention in the Home Falls can cause injuries. They can happen to people of all ages. There are many things you can do to make your home safe and to help prevent falls. What can I do on the outside of my home?  Regularly fix the edges of walkways and driveways and fix any cracks.  Remove anything that might make you trip as you walk through a door, such as a raised step or threshold.  Trim any bushes or trees on the path to your home.  Use bright outdoor lighting.  Clear any walking paths of anything that might make someone trip, such as rocks or tools.  Regularly check to see if handrails are loose or broken. Make sure that both sides of any steps have handrails.  Any raised decks and porches should have guardrails on the edges.  Have any leaves, snow, or ice cleared regularly.  Use sand or salt on walking paths during winter.  Clean up any spills in your garage right away. This includes oil or grease spills. What can I do in the bathroom?  Use night lights.  Install grab bars by the toilet and in the tub and shower. Do not use towel bars as grab bars.  Use non-skid mats or decals in the tub or shower.  If you need to sit down in the shower, use a plastic, non-slip stool.  Keep the floor dry. Clean up any water that spills on the floor as soon as it happens.  Remove soap buildup in the tub or shower  regularly.  Attach bath mats securely with double-sided non-slip rug tape.  Do not have throw rugs and other things on the floor that can make you trip. What can I do in the bedroom?  Use night lights.  Make sure that you have a light by your bed that is easy to reach.  Do not use any sheets or blankets that are too big for your bed. They should not hang down onto the floor.  Have a firm chair that has side arms. You can use this for support while you get dressed.  Do not have throw rugs and other things on the floor that can make you trip. What can I do in the kitchen?  Clean up any spills right away.  Avoid walking on wet floors.  Keep items that you use a lot in easy-to-reach places.  If you need to reach something above you, use a strong step stool that has a grab bar.  Keep electrical cords out of the way.  Do not use floor polish or wax that makes floors slippery. If you must use wax, use non-skid floor wax.  Do not have throw rugs and other things on the floor that  can make you trip. What can I do with my stairs?  Do not leave any items on the stairs.  Make sure that there are handrails on both sides of the stairs and use them. Fix handrails that are broken or loose. Make sure that handrails are as long as the stairways.  Check any carpeting to make sure that it is firmly attached to the stairs. Fix any carpet that is loose or worn.  Avoid having throw rugs at the top or bottom of the stairs. If you do have throw rugs, attach them to the floor with carpet tape.  Make sure that you have a light switch at the top of the stairs and the bottom of the stairs. If you do not have them, ask someone to add them for you. What else can I do to help prevent falls?  Wear shoes that:  Do not have high heels.  Have rubber bottoms.  Are comfortable and fit you well.  Are closed at the toe. Do not wear sandals.  If you use a stepladder:  Make sure that it is fully  opened. Do not climb a closed stepladder.  Make sure that both sides of the stepladder are locked into place.  Ask someone to hold it for you, if possible.  Clearly mark and make sure that you can see:  Any grab bars or handrails.  First and last steps.  Where the edge of each step is.  Use tools that help you move around (mobility aids) if they are needed. These include:  Canes.  Walkers.  Scooters.  Crutches.  Turn on the lights when you go into a dark area. Replace any light bulbs as soon as they burn out.  Set up your furniture so you have a clear path. Avoid moving your furniture around.  If any of your floors are uneven, fix them.  If there are any pets around you, be aware of where they are.  Review your medicines with your doctor. Some medicines can make you feel dizzy. This can increase your chance of falling. Ask your doctor what other things that you can do to help prevent falls. This information is not intended to replace advice given to you by your health care provider. Make sure you discuss any questions you have with your health care provider. Document Released: 11/22/2008 Document Revised: 07/04/2015 Document Reviewed: 03/02/2014 Elsevier Interactive Patient Education  2017 [employer].

## 2019-01-19 NOTE — Progress Notes (Signed)
This visit occurred during the SARS-CoV-2 public health emergency.  Safety protocols were in place, including screening questions prior to the visit, additional usage of staff PPE, and extensive cleaning of exam room while observing appropriate contact time as indicated for disinfecting solutions.  Subjective:     Patient ID: Alan [name] , male    DOB: 08-18-1950 , 68 y.o.   MRN: [id number]   Chief Complaint  Patient presents with  . Hypertension    HPI  Blood pressure at home 107-130/60-78.  Has not eaten today, had a cup of coffee and ginger ale.  He takes his medication at night  Hypertension This is a chronic problem. The current episode started more than 1 year ago. The problem is unchanged. The problem is controlled. Pertinent negatives include no anxiety, headaches or malaise/fatigue. Risk factors for coronary artery disease include sedentary lifestyle. Past treatments include calcium channel blockers. The current treatment provides no improvement. There are no compliance problems.  There is no history of angina. There is no history of chronic renal disease.     Past Medical History:  Diagnosis Date  . Cataracts, bilateral   . Glaucoma   . Renal disorder    kideny stone     No family history on file.   Current Outpatient Medications:  .  amLODipine (NORVASC) 5 MG tablet, TAKE 1 TABLET DAILY, Disp: 90 tablet, Rfl: 3 .  Timolol Maleate (ISTALOL) 0.5 % (DAILY) SOLN, , Disp: , Rfl:  .  Travoprost, BAK Free, (TRAVATAN Z) 0.004 % SOLN ophthalmic solution, Place 1 drop into both eyes at bedtime., Disp: , Rfl:  .  valACYclovir (VALTREX) 500 MG tablet, Take 1 tablet (500 mg total) by mouth 2 (two) times daily., Disp: 30 tablet, Rfl: 0   No Known Allergies   Review of Systems  Constitutional: Negative.  Negative for malaise/fatigue.  Respiratory: Negative.   Cardiovascular: Negative.   Neurological: Negative for dizziness and headaches.  Psychiatric/Behavioral: Negative.       Today's Vitals   01/19/19 1525  BP: 132/78  Pulse: 72  Temp: 98.2 F (36.8 C)  TempSrc: Oral  Weight: 185 lb 6.5 oz (84.1 kg)  Height: 5' 8.8" (1.748 m)  PainSc: 0-No pain   Body mass index is 27.54 kg/m.   Objective:  Physical Exam Constitutional:      Appearance: Normal appearance.  Cardiovascular:     Rate and Rhythm: Normal rate and regular rhythm.  Neurological:     Mental Status: He is alert.         Assessment And Plan:     1. Essential hypertension . B/P is fairly controlled.  Marland Kitchen BMP ordered to check renal function.  . The importance of regular exercise and dietary modification was stressed to the patient.   - BMP8+eGFR  2. Elevated LDL cholesterol level  Will recheck cholesterol level  Discussed limiting his intake of fried and fatty foods to include cheese.    Encouraged to increase fiber intake.  - Lipid Profile   Arnette Felts, FNP    THE PATIENT IS ENCOURAGED TO PRACTICE SOCIAL DISTANCING DUE TO THE COVID-19 PANDEMIC.

## 2019-01-20 LAB — LIPID PANEL
Chol/HDL Ratio: 5 ratio (ref 0.0–5.0)
Cholesterol, Total: 203 mg/dL — ABNORMAL HIGH (ref 100–199)
HDL: 41 mg/dL (ref >39)
LDL Chol Calc (NIH): 145 mg/dL — ABNORMAL HIGH (ref 0–99)
Triglycerides: 96 mg/dL (ref 0–149)
VLDL Cholesterol Cal: 17 mg/dL (ref 5–40)

## 2019-01-20 LAB — BMP8+EGFR
BUN/Creatinine Ratio: 11 (ref 10–24)
BUN: 11 mg/dL (ref 8–27)
CO2: 25 mmol/L (ref 20–29)
Calcium: 9.4 mg/dL (ref 8.6–10.2)
Chloride: 103 mmol/L (ref 96–106)
Creatinine, Ser: 1 mg/dL (ref 0.76–1.27)
GFR calc Af Amer: 89 mL/min/{1.73_m2} (ref >59)
GFR calc non Af Amer: 77 mL/min/{1.73_m2} (ref >59)
Glucose: 81 mg/dL (ref 65–99)
Potassium: 4.1 mmol/L (ref 3.5–5.2)
Sodium: 140 mmol/L (ref 134–144)

## 2019-02-24 DIAGNOSIS — Z23 Encounter for immunization: Secondary | ICD-10-CM | POA: Diagnosis not present

## 2019-03-10 DIAGNOSIS — H40022 Open angle with borderline findings, high risk, left eye: Secondary | ICD-10-CM | POA: Diagnosis not present

## 2019-03-10 DIAGNOSIS — H25811 Combined forms of age-related cataract, right eye: Secondary | ICD-10-CM | POA: Diagnosis not present

## 2019-03-10 DIAGNOSIS — H25812 Combined forms of age-related cataract, left eye: Secondary | ICD-10-CM | POA: Diagnosis not present

## 2019-03-10 DIAGNOSIS — H40021 Open angle with borderline findings, high risk, right eye: Secondary | ICD-10-CM | POA: Diagnosis not present

## 2019-03-24 DIAGNOSIS — Z23 Encounter for immunization: Secondary | ICD-10-CM | POA: Diagnosis not present

## 2019-04-19 ENCOUNTER — Ambulatory Visit: Payer: Medicare Other | Admitting: Nurse Practitioner

## 2019-04-20 ENCOUNTER — Ambulatory Visit (INDEPENDENT_AMBULATORY_CARE_PROVIDER_SITE_OTHER): Payer: Medicare Other | Admitting: Nurse Practitioner

## 2019-04-20 ENCOUNTER — Other Ambulatory Visit: Payer: Self-pay

## 2019-04-20 ENCOUNTER — Encounter: Payer: Self-pay | Admitting: Nurse Practitioner

## 2019-04-20 VITALS — BP 122/84 | HR 71 | Temp 98.2°F | Ht 68.8 in | Wt 184.0 lb

## 2019-04-20 DIAGNOSIS — I10 Essential (primary) hypertension: Secondary | ICD-10-CM | POA: Diagnosis not present

## 2019-04-20 DIAGNOSIS — R351 Nocturia: Secondary | ICD-10-CM | POA: Diagnosis not present

## 2019-04-20 DIAGNOSIS — E7800 Pure hypercholesterolemia, unspecified: Secondary | ICD-10-CM | POA: Diagnosis not present

## 2019-04-20 DIAGNOSIS — Z8042 Family history of malignant neoplasm of prostate: Secondary | ICD-10-CM

## 2019-04-20 NOTE — Progress Notes (Signed)
This visit occurred during the SARS-CoV-2 public health emergency.  Safety protocols were in place, including screening questions prior to the visit, additional usage of staff PPE, and extensive cleaning of exam room while observing appropriate contact time as indicated for disinfecting solutions.  Subjective:     Patient ID: Alan [name] , male    DOB: July 13, 1950 , 69 y.o.   MRN: [id number]   Chief Complaint  Patient presents with  . Hypertension    HPI  Has taken the covid vaccine - only had sore arm.    Wt Readings from Last 3 Encounters: 04/20/19 : 184 lb (83.5 kg) 01/19/19 : 185 lb 6.5 oz (84.1 kg) 01/19/19 : 185 lb 6.4 oz (84.1 kg)   Hypertension This is a chronic problem. The current episode started more than 1 year ago. The problem is unchanged. The problem is controlled. Pertinent negatives include no anxiety, chest pain, headaches, palpitations or shortness of breath. Risk factors for coronary artery disease include male gender and sedentary lifestyle. Past treatments include calcium channel blockers. There are no compliance problems.  There is no history of angina. There is no history of chronic renal disease.     Past Medical History:  Diagnosis Date  . Cataracts, bilateral   . Glaucoma   . Renal disorder    kideny stone     No family history on file.   Current Outpatient Medications:  .  amLODipine (NORVASC) 5 MG tablet, TAKE 1 TABLET DAILY, Disp: 90 tablet, Rfl: 3 .  Ascorbic Acid (VITAMIN C) 100 MG tablet, Take 100 mg by mouth daily., Disp: , Rfl:  .  cholecalciferol (VITAMIN D3) 25 MCG (1000 UNIT) tablet, Take 1,000 Units by mouth daily., Disp: , Rfl:  .  Timolol Maleate (ISTALOL) 0.5 % (DAILY) SOLN, , Disp: , Rfl:  .  Travoprost, BAK Free, (TRAVATAN Z) 0.004 % SOLN ophthalmic solution, Place 1 drop into both eyes at bedtime., Disp: , Rfl:  .  valACYclovir (VALTREX) 500 MG tablet, Take 1 tablet (500 mg total) by mouth 2 (two) times daily., Disp: 30  tablet, Rfl: 0 .  zinc gluconate 50 MG tablet, Take 50 mg by mouth daily., Disp: , Rfl:    No Known Allergies   Review of Systems  Constitutional: Negative.  Negative for fatigue.  Respiratory: Negative for cough and shortness of breath.   Cardiovascular: Negative for chest pain, palpitations and leg swelling.  Neurological: Negative for dizziness and headaches.  Psychiatric/Behavioral: Negative.      Today's Vitals   04/20/19 1527  BP: 122/84  Pulse: 71  Temp: 98.2 F (36.8 C)  TempSrc: Oral  SpO2: 96%  Weight: 184 lb (83.5 kg)  Height: 5' 8.8" (1.748 m)   Body mass index is 27.33 kg/m.   Objective:  Physical Exam Constitutional:      General: He is not in acute distress.    Appearance: Normal appearance.  Cardiovascular:     Rate and Rhythm: Normal rate and regular rhythm.     Pulses: Normal pulses.     Heart sounds: Normal heart sounds. No murmur.  Pulmonary:     Effort: Pulmonary effort is normal. No respiratory distress.     Breath sounds: Normal breath sounds.  Skin:    Capillary Refill: Capillary refill takes less than 2 seconds.  Neurological:     General: No focal deficit present.     Mental Status: He is alert and oriented to person, place, and time.  Psychiatric:  Mood and Affect: Mood normal.        Thought Content: Thought content normal.        Judgment: Judgment normal.         Assessment And Plan:     1. Essential hypertension  Chronic, well controlled.  Continue with current medications  Will check kidney functions - BMP8+eGFR  2. Elevated LDL cholesterol level  Slightly elevated at last visit  Will check lipids - BMP8+eGFR - Lipid panel  3. Family history of prostate cancer  Grandfather had history of prostate cancer - PSA  4. Nocturia  Occasional, will check PSA - PSA   Arnette Felts, FNP    THE PATIENT IS ENCOURAGED TO PRACTICE SOCIAL DISTANCING DUE TO THE COVID-19 PANDEMIC.

## 2019-04-21 LAB — PSA: Prostate Specific Ag, Serum: 1 ng/mL (ref 0.0–4.0)

## 2019-04-21 LAB — BMP8+EGFR
BUN/Creatinine Ratio: 13 (ref 10–24)
BUN: 16 mg/dL (ref 8–27)
CO2: 23 mmol/L (ref 20–29)
Calcium: 9.1 mg/dL (ref 8.6–10.2)
Chloride: 105 mmol/L (ref 96–106)
Creatinine, Ser: 1.23 mg/dL (ref 0.76–1.27)
GFR calc Af Amer: 69 mL/min/{1.73_m2} (ref >59)
GFR calc non Af Amer: 60 mL/min/{1.73_m2} (ref >59)
Glucose: 90 mg/dL (ref 65–99)
Potassium: 4.2 mmol/L (ref 3.5–5.2)
Sodium: 142 mmol/L (ref 134–144)

## 2019-04-21 LAB — LIPID PANEL
Chol/HDL Ratio: 4.9 ratio (ref 0.0–5.0)
Cholesterol, Total: 204 mg/dL — ABNORMAL HIGH (ref 100–199)
HDL: 42 mg/dL (ref >39)
LDL Chol Calc (NIH): 148 mg/dL — ABNORMAL HIGH (ref 0–99)
Triglycerides: 78 mg/dL (ref 0–149)
VLDL Cholesterol Cal: 14 mg/dL (ref 5–40)

## 2019-05-16 DIAGNOSIS — H25811 Combined forms of age-related cataract, right eye: Secondary | ICD-10-CM | POA: Diagnosis not present

## 2019-05-16 DIAGNOSIS — H40022 Open angle with borderline findings, high risk, left eye: Secondary | ICD-10-CM | POA: Diagnosis not present

## 2019-05-16 DIAGNOSIS — H25812 Combined forms of age-related cataract, left eye: Secondary | ICD-10-CM | POA: Diagnosis not present

## 2019-05-16 DIAGNOSIS — H5310 Unspecified subjective visual disturbances: Secondary | ICD-10-CM | POA: Diagnosis not present

## 2019-05-16 DIAGNOSIS — H40021 Open angle with borderline findings, high risk, right eye: Secondary | ICD-10-CM | POA: Diagnosis not present

## 2019-10-18 DIAGNOSIS — H40022 Open angle with borderline findings, high risk, left eye: Secondary | ICD-10-CM | POA: Diagnosis not present

## 2019-10-18 DIAGNOSIS — H40021 Open angle with borderline findings, high risk, right eye: Secondary | ICD-10-CM | POA: Diagnosis not present

## 2019-10-18 DIAGNOSIS — H25811 Combined forms of age-related cataract, right eye: Secondary | ICD-10-CM | POA: Diagnosis not present

## 2019-10-18 DIAGNOSIS — H5310 Unspecified subjective visual disturbances: Secondary | ICD-10-CM | POA: Diagnosis not present

## 2019-10-18 DIAGNOSIS — H25812 Combined forms of age-related cataract, left eye: Secondary | ICD-10-CM | POA: Diagnosis not present

## 2019-10-19 ENCOUNTER — Ambulatory Visit: Payer: Medicare Other | Admitting: Nurse Practitioner

## 2020-01-25 ENCOUNTER — Other Ambulatory Visit: Payer: Self-pay

## 2020-01-25 ENCOUNTER — Ambulatory Visit (INDEPENDENT_AMBULATORY_CARE_PROVIDER_SITE_OTHER): Payer: Medicare Other

## 2020-01-25 ENCOUNTER — Ambulatory Visit (INDEPENDENT_AMBULATORY_CARE_PROVIDER_SITE_OTHER): Payer: Medicare Other | Admitting: Nurse Practitioner

## 2020-01-25 ENCOUNTER — Encounter: Payer: Self-pay | Admitting: Nurse Practitioner

## 2020-01-25 VITALS — BP 120/80 | HR 79 | Temp 98.3°F | Ht 68.4 in | Wt 186.0 lb

## 2020-01-25 DIAGNOSIS — Z8042 Family history of malignant neoplasm of prostate: Secondary | ICD-10-CM

## 2020-01-25 DIAGNOSIS — Z0000 Encounter for general adult medical examination without abnormal findings: Secondary | ICD-10-CM

## 2020-01-25 DIAGNOSIS — I10 Essential (primary) hypertension: Secondary | ICD-10-CM

## 2020-01-25 DIAGNOSIS — E7800 Pure hypercholesterolemia, unspecified: Secondary | ICD-10-CM | POA: Diagnosis not present

## 2020-01-25 DIAGNOSIS — R102 Pelvic and perineal pain: Secondary | ICD-10-CM

## 2020-01-25 LAB — POCT URINALYSIS DIPSTICK
Bilirubin, UA: NEGATIVE
Blood, UA: NEGATIVE
Glucose, UA: NEGATIVE
Ketones, UA: NEGATIVE
Leukocytes, UA: NEGATIVE
Nitrite, UA: NEGATIVE
Protein, UA: POSITIVE — AB
Spec Grav, UA: 1.025 (ref 1.010–1.025)
Urobilinogen, UA: 1 E.U./dL
pH, UA: 6.5 (ref 5.0–8.0)

## 2020-01-25 LAB — POCT UA - MICROALBUMIN
Albumin/Creatinine Ratio, Urine, POC: 300
Creatinine, POC: 300 mg/dL
Microalbumin Ur, POC: 80 mg/L

## 2020-01-25 NOTE — Patient Instructions (Signed)

## 2020-01-25 NOTE — Addendum Note (Signed)
Addended by: Harle Battiest on: 01/25/2020 05:24 PM   Modules accepted: Orders

## 2020-01-25 NOTE — Progress Notes (Signed)
This visit occurred during the SARS-CoV-2 public health emergency.  Safety protocols were in place, including screening questions prior to the visit, additional usage of staff PPE, and extensive cleaning of exam room while observing appropriate contact time as indicated for disinfecting solutions.  Subjective:   Alan [name] is a 69 y.o. male who presents for Medicare Annual/Subsequent preventive examination.  Review of Systems     Cardiac Risk Factors include: advanced age (>9men, >78 women);hypertension;male gender;sedentary lifestyle     Objective:    Today's Vitals   01/25/20 1432  BP: 120/80  Pulse: 79  Temp: 98.3 F (36.8 C)  TempSrc: Oral  Weight: 186 lb (84.4 kg)  Height: 5' 8.4" (1.737 m)   Body mass index is 27.95 kg/m.  Advanced Directives 01/25/2020 01/19/2019 12/22/2017 08/01/2017  Does Patient Have a Medical Advance Directive? No No No No  Would patient like information on creating a medical advance directive? Yes (MAU/Ambulatory/Procedural Areas - Information given) Yes (MAU/Ambulatory/Procedural Areas - Information given) Yes (MAU/Ambulatory/Procedural Areas - Information given) No - Patient declined    Current Medications (verified) Outpatient Encounter Medications as of 01/25/2020  Medication Sig  . amLODipine (NORVASC) 5 MG tablet TAKE 1 TABLET DAILY  . Ascorbic Acid (VITAMIN C) 100 MG tablet Take 100 mg by mouth daily.  . cholecalciferol (VITAMIN D3) 25 MCG (1000 UNIT) tablet Take 1,000 Units by mouth daily.  . Timolol Maleate 0.5 % (DAILY) SOLN   . Travoprost, BAK Free, (TRAVATAN) 0.004 % SOLN ophthalmic solution Place 1 drop into both eyes at bedtime.  . valACYclovir (VALTREX) 500 MG tablet Take 1 tablet (500 mg total) by mouth 2 (two) times daily.  Marland Kitchen zinc gluconate 50 MG tablet Take 50 mg by mouth daily.   No facility-administered encounter medications on file as of 01/25/2020.    Allergies (verified) Patient has no known allergies.    History: Past Medical History:  Diagnosis Date  . Cataracts, bilateral   . Glaucoma   . Renal disorder    kideny stone   History reviewed. No pertinent surgical history. History reviewed. No pertinent family history. Social History   Socioeconomic History  . Marital status: Married    Spouse name: Not on file  . Number of children: Not on file  . Years of education: Not on file  . Highest education level: Not on file  Occupational History  . Not on file  Tobacco Use  . Smoking status: Never Smoker  . Smokeless tobacco: Never Used  Vaping Use  . Vaping Use: Never used  Substance and Sexual Activity  . Alcohol use: Yes    Comment: socially  . Drug use: Never  . Sexual activity: Yes  Other Topics Concern  . Not on file  Social History Narrative  . Not on file   Social Determinants of Health   Financial Resource Strain: Low Risk   . Difficulty of Paying Living Expenses: Not hard at all  Food Insecurity: No Food Insecurity  . Worried About Programme researcher, broadcasting/film/video in the Last Year: Never true  . Ran Out of Food in the Last Year: Never true  Transportation Needs: No Transportation Needs  . Lack of Transportation (Medical): No  . Lack of Transportation (Non-Medical): No  Physical Activity: Inactive  . Days of Exercise per Week: 0 days  . Minutes of Exercise per Session: 0 min  Stress: No Stress Concern Present  . Feeling of Stress : Not at all  Social Connections: Not on file  Tobacco Counseling Counseling given: Not Answered   Clinical Intake:  Pre-visit preparation completed: Yes  Pain : No/denies pain     Nutritional Status: BMI 25 -29 Overweight Nutritional Risks: None Diabetes: No  How often do you need to have someone help you when you read instructions, pamphlets, or other written materials from your doctor or pharmacy?: 1 - Never What is the last grade level you completed in school?: 53yrs college  Diabetic? no  Interpreter Needed?:  No  Information entered by :: NAllen LPN   Activities of Daily Living In your present state of health, do you have any difficulty performing the following activities: 01/25/2020 01/25/2020  Hearing? N N  Vision? N N  Difficulty concentrating or making decisions? N N  Walking or climbing stairs? N N  Dressing or bathing? N N  Doing errands, shopping? N N  Preparing Food and eating ? N -  Using the Toilet? N -  In the past six months, have you accidently leaked urine? N -  Do you have problems with loss of bowel control? N -  Managing your Medications? N -  Managing your Finances? N -  Housekeeping or managing your Housekeeping? N -  Some recent data might be hidden    Patient Care Team: Dorothyann Peng, MD as PCP - General (Internal Medicine) Arnette Felts, FNP (General Practice)  Indicate any recent Medical Services you may have received from other than [facility] providers in the past year (date may be approximate).     Assessment:   This is a routine wellness examination for [place].  Hearing/Vision screen  Hearing Screening   125Hz  250Hz  500Hz  1000Hz  2000Hz  3000Hz  4000Hz  6000Hz  8000Hz   Right ear:           Left ear:           Vision Screening Comments: Regular eye exams, Dr.  Dietary issues and exercise activities discussed: Current Exercise Habits: The patient does not participate in regular exercise at present  Goals    . Patient Stated     01/19/2019, stay healthy    . Patient Stated     01/25/2020, stay healthy      Depression Screen PHQ 2/9 Scores 01/25/2020 01/25/2020 01/19/2019 03/24/2018 12/22/2017 12/22/2017  PHQ - 2 Score 0 0 0 0 0 0  PHQ- 9 Score - - 0 - - -    Fall Risk Fall Risk  01/25/2020 01/25/2020 01/19/2019 03/24/2018 12/22/2017  Falls in the past year? 0 0 0 0 0  Comment - - - - -  Risk for fall due to : Medication side effect - Medication side effect - Medication side effect  Follow up Falls evaluation completed;Education provided;Falls  prevention discussed - Falls evaluation completed;Education provided;Falls prevention discussed - -    FALL RISK PREVENTION PERTAINING TO THE HOME:  Any stairs in or around the home? No  If so, are there any without handrails? n/a Home free of loose throw rugs in walkways, pet beds, electrical cords, etc? Yes  Adequate lighting in your home to reduce risk of falls? Yes   ASSISTIVE DEVICES UTILIZED TO PREVENT FALLS:  Life alert? No  Use of a cane, walker or w/c? No  Grab bars in the bathroom? No  Shower chair or bench in shower? No  Elevated toilet seat or a handicapped toilet? No   TIMED UP AND GO:  Was the test performed? No .     Gait steady and fast without use of assistive device  Cognitive Function:     6CIT Screen 01/25/2020 01/19/2019 12/22/2017  What Year? 0 points 0 points 0 points  What month? 0 points 0 points 0 points  What time? 0 points 0 points 0 points  Count back from 20 0 points 0 points 0 points  Months in reverse 0 points 0 points 0 points  Repeat phrase 0 points 0 points 0 points  Total Score 0 0 0    Immunizations Immunization History  Administered Date(s) Administered  . Influenza, High Dose Seasonal PF 11/24/2018  . Influenza-Unspecified 11/22/2017, 10/25/2019  . Moderna Sars-Covid-2 Vaccination 02/24/2019, 03/24/2019    TDAP status: Up to date  Flu Vaccine status: Up to date  Pneumococcal vaccine status: Due, Education has been provided regarding the importance of this vaccine. Advised may receive this vaccine at local pharmacy or Health Dept. Aware to provide a copy of the vaccination record if obtained from local pharmacy or Health Dept. Verbalized acceptance and understanding.  Covid-19 vaccine status: Completed vaccines  Qualifies for Shingles Vaccine? Yes   Zostavax completed No   Shingrix Completed?: No.    Education has been provided regarding the importance of this vaccine. Patient has been advised to call insurance company to  determine out of pocket expense if they have not yet received this vaccine. Advised may also receive vaccine at local pharmacy or Health Dept. Verbalized acceptance and understanding.  Screening Tests Health Maintenance  Topic Date Due  . PNA vac Low Risk Adult (1 of 2 - PCV13) Never done  . COVID-19 Vaccine (3 - Booster for Moderna series) 09/21/2019  . TETANUS/TDAP  11/25/2022  . COLONOSCOPY  09/15/2028  . INFLUENZA VACCINE  Completed  . Hepatitis C Screening  Completed    Health Maintenance  Health Maintenance Due  Topic Date Due  . PNA vac Low Risk Adult (1 of 2 - PCV13) Never done  . COVID-19 Vaccine (3 - Booster for Moderna series) 09/21/2019    Colorectal cancer screening: Type of screening: Colonoscopy. Completed 09/16/2018. Repeat every 5 years  Lung Cancer Screening: (Low Dose CT Chest recommended if Age 39-80 years, 30 pack-year currently smoking OR have quit w/in 15years.) does not qualify.   Lung Cancer Screening Referral: no  Additional Screening:  Hepatitis C Screening: does qualify; Completed 12/22/2017   Vision Screening: Recommended annual ophthalmology exams for early detection of glaucoma and other disorders of the eye. Is the patient up to date with their annual eye exam?  Yes  Who is the provider or what is the name of the office in which the patient attends annual eye exams? Dr. Edrick Oh If pt is not established with a provider, would they like to be referred to a provider to establish care? No .   Dental Screening: Recommended annual dental exams for proper oral hygiene  Community Resource Referral / Chronic Care Management: CRR required this visit?  No   CCM required this visit?  No      Plan:     I have personally reviewed and noted the following in the patient's chart:   . Medical and social history . Use of alcohol, tobacco or illicit drugs  . Current medications and supplements . Functional ability and status . Nutritional  status . Physical activity . Advanced directives . List of other physicians . Hospitalizations, surgeries, and ER visits in previous 12 months . Vitals . Screenings to include cognitive, depression, and falls . Referrals and appointments  In addition, I have reviewed and discussed with  patient certain preventive protocols, quality metrics, and best practice recommendations. A written personalized care plan for preventive services as well as general preventive health recommendations were provided to patient.     Barb Merinoickeah E Hayleen Clinkscales, LPN   [id number]/10/960412/16/2021   Nurse Notes:

## 2020-01-25 NOTE — Patient Instructions (Signed)
Alan [name] , Thank you for taking time to come for your Medicare Wellness Visit. I appreciate your ongoing commitment to your health goals. Please review the following plan we discussed and let me know if I can assist you in the future.   Screening recommendations/referrals: Colonoscopy: completed 09/16/2018, due 09/16/2023 Recommended yearly ophthalmology/optometry visit for glaucoma screening and checkup Recommended yearly dental visit for hygiene and checkup  Vaccinations: Influenza vaccine: completed  Pneumococcal vaccine: due Tdap vaccine: completed 11/24/2012, due 11/25/2022 Shingles vaccine: discussed   Covid-19:  03/24/2019, 02/24/2019  Advanced directives: Advance directive discussed with you today. I have provided a copy for you to complete at home and have notarized. Once this is complete please bring a copy in to our office so we can scan it into your chart.  Conditions/risks identified: none  Next appointment: Follow up in one year for your annual wellness visit.   Preventive Care 69 Years and Older, Male Preventive care refers to lifestyle choices and visits with your health care provider that can promote health and wellness. What does preventive care include?  A yearly physical exam. This is also called an annual well check.  Dental exams once or twice a year.  Routine eye exams. Ask your health care provider how often you should have your eyes checked.  Personal lifestyle choices, including:  Daily care of your teeth and gums.  Regular physical activity.  Eating a healthy diet.  Avoiding tobacco and drug use.  Limiting alcohol use.  Practicing safe sex.  Taking low doses of aspirin every day.  Taking vitamin and mineral supplements as recommended by your health care provider. What happens during an annual well check? The services and screenings done by your health care provider during your annual well check will depend on your age, overall health, lifestyle  risk factors, and family history of disease. Counseling  Your health care provider may ask you questions about your:  Alcohol use.  Tobacco use.  Drug use.  Emotional well-being.  Home and relationship well-being.  Sexual activity.  Eating habits.  History of falls.  Memory and ability to understand (cognition).  Work and work Astronomer. Screening  You may have the following tests or measurements:  Height, weight, and BMI.  Blood pressure.  Lipid and cholesterol levels. These may be checked every 5 years, or more frequently if you are over 61 years old.  Skin check.  Lung cancer screening. You may have this screening every year starting at age 55 if you have a 30-pack-year history of smoking and currently smoke or have quit within the past 15 years.  Fecal occult blood test (FOBT) of the stool. You may have this test every year starting at age 64.  Flexible sigmoidoscopy or colonoscopy. You may have a sigmoidoscopy every 5 years or a colonoscopy every 10 years starting at age 60.  Prostate cancer screening. Recommendations will vary depending on your family history and other risks.  Hepatitis C blood test.  Hepatitis B blood test.  Sexually transmitted disease (STD) testing.  Diabetes screening. This is done by checking your blood sugar (glucose) after you have not eaten for a while (fasting). You may have this done every 1-3 years.  Abdominal aortic aneurysm (AAA) screening. You may need this if you are a current or former smoker.  Osteoporosis. You may be screened starting at age 31 if you are at high risk. Talk with your health care provider about your test results, treatment options, and if necessary, the  need for more tests. Vaccines  Your health care provider may recommend certain vaccines, such as:  Influenza vaccine. This is recommended every year.  Tetanus, diphtheria, and acellular pertussis (Tdap, Td) vaccine. You may need a Td booster every 10  years.  Zoster vaccine. You may need this after age 60.  Pneumococcal 13-valent conjugate (PCV13) vaccine. One dose is recommended after age 61.  Pneumococcal polysaccharide (PPSV23) vaccine. One dose is recommended after age 12. Talk to your health care provider about which screenings and vaccines you need and how often you need them. This information is not intended to replace advice given to you by your health care provider. Make sure you discuss any questions you have with your health care provider. Document Released: 02/22/2015 Document Revised: 10/16/2015 Document Reviewed: 11/27/2014 Elsevier Interactive Patient Education  2017 [place] Prevention in the Home Falls can cause injuries. They can happen to people of all ages. There are many things you can do to make your home safe and to help prevent falls. What can I do on the outside of my home?  Regularly fix the edges of walkways and driveways and fix any cracks.  Remove anything that might make you trip as you walk through a door, such as a raised step or threshold.  Trim any bushes or trees on the path to your home.  Use bright outdoor lighting.  Clear any walking paths of anything that might make someone trip, such as rocks or tools.  Regularly check to see if handrails are loose or broken. Make sure that both sides of any steps have handrails.  Any raised decks and porches should have guardrails on the edges.  Have any leaves, snow, or ice cleared regularly.  Use sand or salt on walking paths during winter.  Clean up any spills in your garage right away. This includes oil or grease spills. What can I do in the bathroom?  Use night lights.  Install grab bars by the toilet and in the tub and shower. Do not use towel bars as grab bars.  Use non-skid mats or decals in the tub or shower.  If you need to sit down in the shower, use a plastic, non-slip stool.  Keep the floor dry. Clean up any water that  spills on the floor as soon as it happens.  Remove soap buildup in the tub or shower regularly.  Attach bath mats securely with double-sided non-slip rug tape.  Do not have throw rugs and other things on the floor that can make you trip. What can I do in the bedroom?  Use night lights.  Make sure that you have a light by your bed that is easy to reach.  Do not use any sheets or blankets that are too big for your bed. They should not hang down onto the floor.  Have a firm chair that has side arms. You can use this for support while you get dressed.  Do not have throw rugs and other things on the floor that can make you trip. What can I do in the kitchen?  Clean up any spills right away.  Avoid walking on wet floors.  Keep items that you use a lot in easy-to-reach places.  If you need to reach something above you, use a strong step stool that has a grab bar.  Keep electrical cords out of the way.  Do not use floor polish or wax that makes floors slippery. If you must use wax,  use non-skid floor wax.  Do not have throw rugs and other things on the floor that can make you trip. What can I do with my stairs?  Do not leave any items on the stairs.  Make sure that there are handrails on both sides of the stairs and use them. Fix handrails that are broken or loose. Make sure that handrails are as long as the stairways.  Check any carpeting to make sure that it is firmly attached to the stairs. Fix any carpet that is loose or worn.  Avoid having throw rugs at the top or bottom of the stairs. If you do have throw rugs, attach them to the floor with carpet tape.  Make sure that you have a light switch at the top of the stairs and the bottom of the stairs. If you do not have them, ask someone to add them for you. What else can I do to help prevent falls?  Wear shoes that:  Do not have high heels.  Have rubber bottoms.  Are comfortable and fit you well.  Are closed at the  toe. Do not wear sandals.  If you use a stepladder:  Make sure that it is fully opened. Do not climb a closed stepladder.  Make sure that both sides of the stepladder are locked into place.  Ask someone to hold it for you, if possible.  Clearly mark and make sure that you can see:  Any grab bars or handrails.  First and last steps.  Where the edge of each step is.  Use tools that help you move around (mobility aids) if they are needed. These include:  Canes.  Walkers.  Scooters.  Crutches.  Turn on the lights when you go into a dark area. Replace any light bulbs as soon as they burn out.  Set up your furniture so you have a clear path. Avoid moving your furniture around.  If any of your floors are uneven, fix them.  If there are any pets around you, be aware of where they are.  Review your medicines with your doctor. Some medicines can make you feel dizzy. This can increase your chance of falling. Ask your doctor what other things that you can do to help prevent falls. This information is not intended to replace advice given to you by your health care provider. Make sure you discuss any questions you have with your health care provider. Document Released: 11/22/2008 Document Revised: 07/04/2015 Document Reviewed: 03/02/2014 Elsevier Interactive Patient Education  2017 [employer].

## 2020-01-25 NOTE — Progress Notes (Signed)
This visit occurred during the SARS-CoV-2 public health emergency.  Safety protocols were in place, including screening questions prior to the visit, additional usage of staff PPE, and extensive cleaning of exam room while observing appropriate contact time as indicated for disinfecting solutions.  Subjective:     Patient ID: Alan [name] , male    DOB: Aug 07, 1950 , 69 y.o.   MRN: [id number]   Chief Complaint  Patient presents with  . Hypertension    HPI  Patient here for a blood pressure f/u.  Drinks approximately 48 oz water a day.    Brother diagnosed with prostate cancer and his dad has a history of prostate.     Hypertension This is a chronic problem. The current episode started more than 1 year ago. The problem is unchanged. The problem is controlled. Pertinent negatives include no anxiety, chest pain, headaches, palpitations or shortness of breath. Risk factors for coronary artery disease include male gender, sedentary lifestyle and dyslipidemia. Past treatments include calcium channel blockers. The current treatment provides significant improvement. There are no compliance problems.  There is no history of angina or kidney disease. There is no history of chronic renal disease.     Past Medical History:  Diagnosis Date  . Cataracts, bilateral   . Glaucoma   . Renal disorder    kideny stone     History reviewed. No pertinent family history.   Current Outpatient Medications:  .  amLODipine (NORVASC) 5 MG tablet, TAKE 1 TABLET DAILY, Disp: 90 tablet, Rfl: 3 .  Ascorbic Acid (VITAMIN C) 100 MG tablet, Take 100 mg by mouth daily., Disp: , Rfl:  .  cholecalciferol (VITAMIN D3) 25 MCG (1000 UNIT) tablet, Take 1,000 Units by mouth daily., Disp: , Rfl:  .  Timolol Maleate 0.5 % (DAILY) SOLN, , Disp: , Rfl:  .  Travoprost, BAK Free, (TRAVATAN) 0.004 % SOLN ophthalmic solution, Place 1 drop into both eyes at bedtime., Disp: , Rfl:  .  valACYclovir (VALTREX) 500 MG tablet, Take  1 tablet (500 mg total) by mouth 2 (two) times daily., Disp: 30 tablet, Rfl: 0 .  zinc gluconate 50 MG tablet, Take 50 mg by mouth daily., Disp: , Rfl:    No Known Allergies   Review of Systems  Constitutional: Negative.   Respiratory: Negative for shortness of breath and wheezing.   Cardiovascular: Negative for chest pain, palpitations and leg swelling.  Gastrointestinal: Positive for abdominal pain (pelvic pain. ). Negative for abdominal distention and constipation.  Genitourinary: Negative for flank pain, frequency and hematuria.       Low bladder pain occurring for the last 6-7 weeks. Intermittent.  Lasting a few hours  Musculoskeletal: Negative.   Neurological: Negative for dizziness and headaches.  Psychiatric/Behavioral: Negative.      Today's Vitals   01/25/20 1415  BP: 120/80  Pulse: 79  Temp: 98.3 F (36.8 C)  TempSrc: Oral  Weight: 186 lb (84.4 kg)  Height: 5' 8.4" (1.737 m)  PainSc: 0-No pain   Body mass index is 27.95 kg/m.   Objective:  Physical Exam Vitals reviewed.  Constitutional:      General: He is not in acute distress.    Appearance: Normal appearance.  Cardiovascular:     Rate and Rhythm: Normal rate and regular rhythm.     Pulses: Normal pulses.     Heart sounds: Normal heart sounds. No murmur heard.   Pulmonary:     Effort: Pulmonary effort is normal. No respiratory distress.  Breath sounds: Normal breath sounds.  Skin:    Capillary Refill: Capillary refill takes less than 2 seconds.  Neurological:     General: No focal deficit present.     Mental Status: He is alert.     Cranial Nerves: No cranial nerve deficit.     Sensory: No sensory deficit.  Psychiatric:        Mood and Affect: Mood normal.        Behavior: Behavior normal.        Thought Content: Thought content normal.        Judgment: Judgment normal.         Assessment And Plan:     1. Essential hypertension  Chronic, blood pressure is well controlled  Continue  with current medications - BMP8+eGFR  2. Elevated LDL cholesterol level  Will check levels, he has eaten bacon and sausage today - Lipid panel - BMP8+eGFR  3. Pelvic pain  Tenderness to pelvic area when palpating  Will check urinalysis to check for urinary tract infection  Encouraged to stay well hydrated with water - CBC  4. Family history of prostate cancer  brother has just been diagnosed with prostate cancer and is receiving treatment   Patient was given opportunity to ask questions. Patient verbalized understanding of the plan and was able to repeat key elements of the plan. All questions were answered to their satisfaction.    Jeanell Sparrow, FNP, have reviewed all documentation for this visit. The documentation on 01/25/20 for the exam, diagnosis, procedures, and orders are all accurate and complete.   THE PATIENT IS ENCOURAGED TO PRACTICE SOCIAL DISTANCING DUE TO THE COVID-19 PANDEMIC.

## 2020-01-26 LAB — LIPID PANEL
Chol/HDL Ratio: 4.9 ratio (ref 0.0–5.0)
Cholesterol, Total: 217 mg/dL — ABNORMAL HIGH (ref 100–199)
HDL: 44 mg/dL (ref >39)
LDL Chol Calc (NIH): 154 mg/dL — ABNORMAL HIGH (ref 0–99)
Triglycerides: 107 mg/dL (ref 0–149)
VLDL Cholesterol Cal: 19 mg/dL (ref 5–40)

## 2020-01-26 LAB — CBC
Hematocrit: 44.6 % (ref 37.5–51.0)
Hemoglobin: 15.6 g/dL (ref 13.0–17.7)
MCH: 31.4 pg (ref 26.6–33.0)
MCHC: 35 g/dL (ref 31.5–35.7)
MCV: 90 fL (ref 79–97)
Platelets: 130 10*3/uL — ABNORMAL LOW (ref 150–450)
RBC: 4.97 x10E6/uL (ref 4.14–5.80)
RDW: 13.8 % (ref 11.6–15.4)
WBC: 6.3 10*3/uL (ref 3.4–10.8)

## 2020-01-26 LAB — BMP8+EGFR
BUN/Creatinine Ratio: 13 (ref 10–24)
BUN: 15 mg/dL (ref 8–27)
CO2: 25 mmol/L (ref 20–29)
Calcium: 9.4 mg/dL (ref 8.6–10.2)
Chloride: 102 mmol/L (ref 96–106)
Creatinine, Ser: 1.2 mg/dL (ref 0.76–1.27)
GFR calc Af Amer: 71 mL/min/{1.73_m2} (ref >59)
GFR calc non Af Amer: 61 mL/min/{1.73_m2} (ref >59)
Glucose: 80 mg/dL (ref 65–99)
Potassium: 4.1 mmol/L (ref 3.5–5.2)
Sodium: 141 mmol/L (ref 134–144)

## 2020-02-01 DIAGNOSIS — Z23 Encounter for immunization: Secondary | ICD-10-CM | POA: Diagnosis not present

## 2020-04-04 ENCOUNTER — Other Ambulatory Visit: Payer: Self-pay | Admitting: Nurse Practitioner

## 2020-04-25 ENCOUNTER — Ambulatory Visit: Payer: Medicare Other | Admitting: Nurse Practitioner

## 2020-05-09 ENCOUNTER — Ambulatory Visit: Payer: Medicare Other | Admitting: Nurse Practitioner

## 2020-05-22 ENCOUNTER — Ambulatory Visit (INDEPENDENT_AMBULATORY_CARE_PROVIDER_SITE_OTHER): Payer: Medicare Other | Admitting: Nurse Practitioner

## 2020-05-22 ENCOUNTER — Encounter: Payer: Self-pay | Admitting: Nurse Practitioner

## 2020-05-22 ENCOUNTER — Other Ambulatory Visit: Payer: Self-pay

## 2020-05-22 VITALS — BP 116/70 | HR 72 | Temp 98.5°F | Ht 68.4 in | Wt 180.0 lb

## 2020-05-22 DIAGNOSIS — E7800 Pure hypercholesterolemia, unspecified: Secondary | ICD-10-CM | POA: Diagnosis not present

## 2020-05-22 DIAGNOSIS — I10 Essential (primary) hypertension: Secondary | ICD-10-CM | POA: Diagnosis not present

## 2020-05-22 DIAGNOSIS — Z8619 Personal history of other infectious and parasitic diseases: Secondary | ICD-10-CM | POA: Diagnosis not present

## 2020-05-22 DIAGNOSIS — N529 Male erectile dysfunction, unspecified: Secondary | ICD-10-CM

## 2020-05-22 DIAGNOSIS — Z23 Encounter for immunization: Secondary | ICD-10-CM

## 2020-05-22 MED ORDER — VALACYCLOVIR HCL 500 MG PO TABS: 500.0000 mg | ORAL_TABLET | Freq: Two times a day (BID) | ORAL | 0 refills | Status: AC

## 2020-05-22 MED ORDER — SILDENAFIL CITRATE 100 MG PO TABS
100.0000 mg | ORAL_TABLET | Freq: Every day | ORAL | 3 refills | Status: AC | PRN
Start: 2020-05-22 — End: ?

## 2020-05-22 NOTE — Progress Notes (Signed)
I,Yamilka Roman [employer] as a Neurosurgeon for [employer], FNP.,have documented all relevant documentation on the behalf of Arnette Felts, FNP,as directed by  Arnette Felts, FNP while in the presence of Arnette Felts, FNP. This visit occurred during the SARS-CoV-2 public health emergency.  Safety protocols were in place, including screening questions prior to the visit, additional usage of staff PPE, and extensive cleaning of exam room while observing appropriate contact time as indicated for disinfecting solutions.  Subjective:     Patient ID: Alan [name] , male    DOB: 1950-09-15 , 70 y.o.   MRN: [id number]   Chief Complaint  Patient presents with  . Hypertension    HPI  Patient here for a blood pressure f/u.     Hypertension This is a chronic problem. The current episode started more than 1 year ago. The problem is unchanged. The problem is controlled. Pertinent negatives include no anxiety, chest pain, headaches, palpitations or shortness of breath. Risk factors for coronary artery disease include male gender, sedentary lifestyle and dyslipidemia. Past treatments include calcium channel blockers. The current treatment provides significant improvement. There are no compliance problems.  There is no history of angina or kidney disease. There is no history of chronic renal disease.     Past Medical History:  Diagnosis Date  . Cataracts, bilateral   . Glaucoma   . Renal disorder    kideny stone     History reviewed. No pertinent family history.   Current Outpatient Medications:  .  amLODipine (NORVASC) 5 MG tablet, TAKE 1 TABLET DAILY, Disp: 90 tablet, Rfl: 3 .  Ascorbic Acid (VITAMIN C) 100 MG tablet, Take 100 mg by mouth daily., Disp: , Rfl:  .  cholecalciferol (VITAMIN D3) 25 MCG (1000 UNIT) tablet, Take 1,000 Units by mouth daily., Disp: , Rfl:  .  Timolol Maleate 0.5 % (DAILY) SOLN, Place 1 drop into both eyes at bedtime., Disp: , Rfl:  .  Travoprost, BAK Free,  (TRAVATAN) 0.004 % SOLN ophthalmic solution, Place 1 drop into both eyes at bedtime., Disp: , Rfl:  .  zinc gluconate 50 MG tablet, Take 50 mg by mouth daily., Disp: , Rfl:  .  sildenafil (VIAGRA) 100 MG tablet, Take 1 tablet (100 mg total) by mouth daily as needed for erectile dysfunction., Disp: 10 tablet, Rfl: 3 .  valACYclovir (VALTREX) 500 MG tablet, Take 1 tablet (500 mg total) by mouth 2 (two) times daily., Disp: 180 tablet, Rfl: 0   No Known Allergies   Review of Systems  Constitutional: Negative.   Respiratory: Negative for shortness of breath.   Cardiovascular: Negative.  Negative for chest pain, palpitations and leg swelling.  Neurological: Negative for dizziness and headaches.  Psychiatric/Behavioral: Negative.      Today's Vitals   05/22/20 1601  BP: 116/70  Pulse: 72  Temp: 98.5 F (36.9 C)  TempSrc: Oral  Weight: 180 lb (81.6 kg)  Height: 5' 8.4" (1.737 m)  PainSc: 0-No pain   Body mass index is 27.05 kg/m.   Objective:  Physical Exam Constitutional:      General: He is not in acute distress.    Appearance: Normal appearance.  Cardiovascular:     Rate and Rhythm: Normal rate and regular rhythm.     Pulses: Normal pulses.     Heart sounds: Normal heart sounds. No murmur heard.   Pulmonary:     Effort: Pulmonary effort is normal. No respiratory distress.     Breath sounds: Normal breath sounds.  No wheezing.  Skin:    Capillary Refill: Capillary refill takes less than 2 seconds.  Neurological:     General: No focal deficit present.     Mental Status: He is alert and oriented to person, place, and time.     Cranial Nerves: No cranial nerve deficit.  Psychiatric:        Mood and Affect: Mood normal.        Behavior: Behavior normal.        Thought Content: Thought content normal.        Judgment: Judgment normal.         Assessment And Plan:     1. Essential hypertension . B/P is well controlled.  Marland Kitchen BMP ordered to check renal function.  . If  his blood pressure remains in good standing will consider decreasing  . The importance of regular exercise and dietary modification was stressed to the patient.  . Stressed importance of losing ten percent of her body weight to help with B/P control.  - BMP8+eGFR  2. Encounter for immunization - Pneumococcal polysaccharide vaccine 23-valent greater than or equal to 2yo subcutaneous/IM  3. Elevated LDL cholesterol level  Chronic, no current medications - Lipid panel  4. History of cold sores  Refill sent for valtrex  5. Erectile dysfunction, unspecified erectile dysfunction type - sildenafil (VIAGRA) 100 MG tablet; Take 1 tablet (100 mg total) by mouth daily as needed for erectile dysfunction.  Dispense: 10 tablet; Refill: 3     Patient was given opportunity to ask questions. Patient verbalized understanding of the plan and was able to repeat key elements of the plan. All questions were answered to their satisfaction.  Arnette Felts, FNP   I, Arnette Felts, FNP, have reviewed all documentation for this visit. The documentation on 05/22/20 for the exam, diagnosis, procedures, and orders are all accurate and complete.   IF YOU HAVE BEEN REFERRED TO A SPECIALIST, IT MAY TAKE 1-2 WEEKS TO SCHEDULE/PROCESS THE REFERRAL. IF YOU HAVE NOT HEARD FROM US/SPECIALIST IN TWO WEEKS, PLEASE GIVE [place] A CALL AT [phone].   THE PATIENT IS ENCOURAGED TO PRACTICE SOCIAL DISTANCING DUE TO THE COVID-19 PANDEMIC.

## 2020-05-22 NOTE — Patient Instructions (Signed)

## 2020-05-23 LAB — LIPID PANEL
Chol/HDL Ratio: 4.7 ratio (ref 0.0–5.0)
Cholesterol, Total: 194 mg/dL (ref 100–199)
HDL: 41 mg/dL (ref >39)
LDL Chol Calc (NIH): 136 mg/dL — ABNORMAL HIGH (ref 0–99)
Triglycerides: 91 mg/dL (ref 0–149)
VLDL Cholesterol Cal: 17 mg/dL (ref 5–40)

## 2020-05-23 LAB — BMP8+EGFR
BUN/Creatinine Ratio: 11 (ref 10–24)
BUN: 12 mg/dL (ref 8–27)
CO2: 23 mmol/L (ref 20–29)
Calcium: 9.5 mg/dL (ref 8.6–10.2)
Chloride: 102 mmol/L (ref 96–106)
Creatinine, Ser: 1.08 mg/dL (ref 0.76–1.27)
Glucose: 90 mg/dL (ref 65–99)
Potassium: 4.5 mmol/L (ref 3.5–5.2)
Sodium: 138 mmol/L (ref 134–144)
eGFR: 74 mL/min/{1.73_m2} (ref >59)

## 2020-05-31 ENCOUNTER — Telehealth: Payer: Self-pay

## 2020-05-31 NOTE — Telephone Encounter (Signed)
Left the patient a message to call back for lab results. 

## 2020-05-31 NOTE — Telephone Encounter (Signed)
-----   Message from Arnette Felts, FNP sent at 05/30/2020  1:36 PM EDT ----- Your cholesterol levels are improving, continue limiting your intake of fried and fatty foods

## 2020-09-14 DIAGNOSIS — Z1152 Encounter for screening for COVID-19: Secondary | ICD-10-CM | POA: Diagnosis not present

## 2020-10-16 DIAGNOSIS — Z1152 Encounter for screening for COVID-19: Secondary | ICD-10-CM | POA: Diagnosis not present

## 2020-11-15 DIAGNOSIS — Z1152 Encounter for screening for COVID-19: Secondary | ICD-10-CM | POA: Diagnosis not present

## 2020-11-21 ENCOUNTER — Ambulatory Visit: Payer: Medicare Other | Admitting: Nurse Practitioner

## 2020-12-02 ENCOUNTER — Other Ambulatory Visit: Payer: Self-pay

## 2020-12-02 ENCOUNTER — Encounter: Payer: Self-pay | Admitting: Nurse Practitioner

## 2020-12-02 ENCOUNTER — Ambulatory Visit (INDEPENDENT_AMBULATORY_CARE_PROVIDER_SITE_OTHER): Payer: Medicare Other | Admitting: Nurse Practitioner

## 2020-12-02 VITALS — BP 122/60 | HR 64 | Temp 98.3°F | Ht 68.8 in | Wt 183.8 lb

## 2020-12-02 DIAGNOSIS — E7800 Pure hypercholesterolemia, unspecified: Secondary | ICD-10-CM | POA: Diagnosis not present

## 2020-12-02 DIAGNOSIS — I10 Essential (primary) hypertension: Secondary | ICD-10-CM

## 2020-12-02 NOTE — Progress Notes (Signed)
I,Tianna Badgett,acting as a Neurosurgeon for [employer], FNP.,have documented all relevant documentation on the behalf of Arnette Felts, FNP,as directed by  Arnette Felts, FNP while in the presence of Arnette Felts, FNP.  This visit occurred during the SARS-CoV-2 public health emergency.  Safety protocols were in place, including screening questions prior to the visit, additional usage of staff PPE, and extensive cleaning of exam room while observing appropriate contact time as indicated for disinfecting solutions.  Subjective:     Patient ID: Alan [name] , male    DOB: [birth year] , 70 y.o.   MRN: [id number]   Chief Complaint  Patient presents with   Hypertension    HPI  Patient here for a blood pressure f/u.   He reports having chicken pox vaccine and does not want the shingrix vaccine.   Hypertension This is a chronic problem. The current episode started more than 1 year ago. The problem is unchanged. The problem is controlled. Pertinent negatives include no anxiety, chest pain, headaches, palpitations or shortness of breath. Risk factors for coronary artery disease include male gender, sedentary lifestyle and dyslipidemia. Past treatments include calcium channel blockers. The current treatment provides significant improvement. There are no compliance problems.  There is no history of angina or kidney disease. There is no history of chronic renal disease.    Past Medical History:  Diagnosis Date   Cataracts, bilateral    Glaucoma    Renal disorder    kideny stone     History reviewed. No pertinent family history.   Current Outpatient Medications:    amLODipine (NORVASC) 5 MG tablet, TAKE 1 TABLET DAILY, Disp: 90 tablet, Rfl: 3   Ascorbic Acid (VITAMIN C) 100 MG tablet, Take 100 mg by mouth daily., Disp: , Rfl:    cholecalciferol (VITAMIN D3) 25 MCG (1000 UNIT) tablet, Take 1,000 Units by mouth daily., Disp: , Rfl:    sildenafil (VIAGRA) 100 MG tablet, Take 1 tablet (100 mg  total) by mouth daily as needed for erectile dysfunction., Disp: 10 tablet, Rfl: 3   Timolol Maleate 0.5 % (DAILY) SOLN, Place 1 drop into both eyes at bedtime., Disp: , Rfl:    Travoprost, BAK Free, (TRAVATAN) 0.004 % SOLN ophthalmic solution, Place 1 drop into both eyes at bedtime., Disp: , Rfl:    valACYclovir (VALTREX) 500 MG tablet, Take 1 tablet (500 mg total) by mouth 2 (two) times daily., Disp: 180 tablet, Rfl: 0   zinc gluconate 50 MG tablet, Take 50 mg by mouth daily., Disp: , Rfl:    No Known Allergies   Review of Systems  Constitutional: Negative.   Respiratory: Negative.  Negative for shortness of breath.   Cardiovascular: Negative.  Negative for chest pain and palpitations.  Gastrointestinal: Negative.   Neurological: Negative.  Negative for dizziness and headaches.  Psychiatric/Behavioral: Negative.      Today's Vitals   12/02/20 1501  BP: 122/60  Pulse: 64  Temp: 98.3 F (36.8 C)  TempSrc: Oral  Weight: 183 lb 12.8 oz (83.4 kg)  Height: 5' 8.8" (1.748 m)   Body mass index is 27.3 kg/m.  Wt Readings from Last 3 Encounters:  12/02/20 183 lb 12.8 oz (83.4 kg)  05/22/20 180 lb (81.6 kg)  01/25/20 186 lb (84.4 kg)    Objective:  Physical Exam Vitals reviewed.  Constitutional:      Appearance: Normal appearance.  Cardiovascular:     Pulses: Normal pulses.     Heart sounds: Normal heart sounds. No murmur  heard. Neurological:     Mental Status: He is alert.        Assessment And Plan:     1. Essential hypertension Comments: Blood pressure is normal, will hold amlodipine and he is to check his BP every other day and call with readings. If controlled will remain off medication - BMP8+EGFR  2. Elevated LDL cholesterol level Comments: Cholesterol levels are improving - Lipid panel - BMP8+EGFR    Patient was given opportunity to ask questions. Patient verbalized understanding of the plan and was able to repeat key elements of the plan. All questions were  answered to their satisfaction.  Arnette Felts, FNP   I, Arnette Felts, FNP, have reviewed all documentation for this visit. The documentation on 12/02/20 for the exam, diagnosis, procedures, and orders are all accurate and complete.   IF YOU HAVE BEEN REFERRED TO A SPECIALIST, IT MAY TAKE 1-2 WEEKS TO SCHEDULE/PROCESS THE REFERRAL. IF YOU HAVE NOT HEARD FROM US/SPECIALIST IN TWO WEEKS, PLEASE GIVE [place] A CALL AT [phone].   THE PATIENT IS ENCOURAGED TO PRACTICE SOCIAL DISTANCING DUE TO THE COVID-19 PANDEMIC.

## 2020-12-02 NOTE — Patient Instructions (Addendum)

## 2020-12-03 LAB — BMP8+EGFR
BUN/Creatinine Ratio: 15 (ref 10–24)
BUN: 16 mg/dL (ref 8–27)
CO2: 25 mmol/L (ref 20–29)
Calcium: 9 mg/dL (ref 8.6–10.2)
Chloride: 105 mmol/L (ref 96–106)
Creatinine, Ser: 1.08 mg/dL (ref 0.76–1.27)
Glucose: 81 mg/dL (ref 70–99)
Potassium: 4.6 mmol/L (ref 3.5–5.2)
Sodium: 142 mmol/L (ref 134–144)
eGFR: 74 mL/min/{1.73_m2} (ref >59)

## 2020-12-03 LAB — LIPID PANEL
Chol/HDL Ratio: 3.6 ratio (ref 0.0–5.0)
Cholesterol, Total: 172 mg/dL (ref 100–199)
HDL: 48 mg/dL (ref >39)
LDL Chol Calc (NIH): 111 mg/dL — ABNORMAL HIGH (ref 0–99)
Triglycerides: 66 mg/dL (ref 0–149)
VLDL Cholesterol Cal: 13 mg/dL (ref 5–40)

## 2021-01-15 ENCOUNTER — Ambulatory Visit: Payer: Medicare Other | Admitting: Nurse Practitioner

## 2021-01-29 ENCOUNTER — Ambulatory Visit (INDEPENDENT_AMBULATORY_CARE_PROVIDER_SITE_OTHER): Payer: Medicare Other | Admitting: Nurse Practitioner

## 2021-01-29 ENCOUNTER — Encounter: Payer: Self-pay | Admitting: Nurse Practitioner

## 2021-01-29 ENCOUNTER — Ambulatory Visit (INDEPENDENT_AMBULATORY_CARE_PROVIDER_SITE_OTHER): Payer: Medicare Other

## 2021-01-29 ENCOUNTER — Other Ambulatory Visit: Payer: Self-pay

## 2021-01-29 VITALS — BP 122/68 | HR 70 | Temp 98.8°F | Ht 68.6 in | Wt 183.6 lb

## 2021-01-29 VITALS — BP 122/68 | HR 70 | Temp 98.8°F | Ht 68.6 in | Wt 183.0 lb

## 2021-01-29 DIAGNOSIS — Z0000 Encounter for general adult medical examination without abnormal findings: Secondary | ICD-10-CM | POA: Diagnosis not present

## 2021-01-29 DIAGNOSIS — I10 Essential (primary) hypertension: Secondary | ICD-10-CM | POA: Diagnosis not present

## 2021-01-29 NOTE — Patient Instructions (Signed)

## 2021-01-29 NOTE — Progress Notes (Signed)
This visit occurred during the SARS-CoV-2 public health emergency.  Safety protocols were in place, including screening questions prior to the visit, additional usage of staff PPE, and extensive cleaning of exam room while observing appropriate contact time as indicated for disinfecting solutions.  Subjective:   Alan [name] is a 70 y.o. male who presents for Medicare Annual/Subsequent preventive examination.  Review of Systems     Cardiac Risk Factors include: advanced age (>33men, >43 women);male gender     Objective:    Today's Vitals   01/29/21 1454  BP: 122/68  Pulse: 70  Temp: 98.8 F (37.1 C)  TempSrc: Oral  Weight: 183 lb (83 kg)  Height: 5' 8.6" (1.742 m)   Body mass index is 27.34 kg/m.  Advanced Directives 01/29/2021 01/25/2020 01/19/2019 12/22/2017 08/01/2017  Does Patient Have a Medical Advance Directive? No No No No No  Would patient like information on creating a medical advance directive? No - Patient declined Yes (MAU/Ambulatory/Procedural Areas - Information given) Yes (MAU/Ambulatory/Procedural Areas - Information given) Yes (MAU/Ambulatory/Procedural Areas - Information given) No - Patient declined    Current Medications (verified) Outpatient Encounter Medications as of 01/29/2021  Medication Sig   Ascorbic Acid (VITAMIN C) 100 MG tablet Take 100 mg by mouth daily.   cholecalciferol (VITAMIN D3) 25 MCG (1000 UNIT) tablet Take 1,000 Units by mouth daily.   sildenafil (VIAGRA) 100 MG tablet Take 1 tablet (100 mg total) by mouth daily as needed for erectile dysfunction.   Timolol Maleate 0.5 % (DAILY) SOLN Place 1 drop into both eyes at bedtime.   Travoprost, BAK Free, (TRAVATAN) 0.004 % SOLN ophthalmic solution Place 1 drop into both eyes at bedtime.   valACYclovir (VALTREX) 500 MG tablet Take 1 tablet (500 mg total) by mouth 2 (two) times daily.   zinc gluconate 50 MG tablet Take 50 mg by mouth daily.   No facility-administered encounter medications on  file as of 01/29/2021.    Allergies (verified) Patient has no known allergies.   History: Past Medical History:  Diagnosis Date   Cataracts, bilateral    Glaucoma    Renal disorder    kideny stone   History reviewed. No pertinent surgical history. History reviewed. No pertinent family history. Social History   Socioeconomic History   Marital status: Married    Spouse name: Not on file   Number of children: Not on file   Years of education: Not on file   Highest education level: Not on file  Occupational History   Not on file  Tobacco Use   Smoking status: Never   Smokeless tobacco: Never  Vaping Use   Vaping Use: Never used  Substance and Sexual Activity   Alcohol use: Yes    Comment: socially   Drug use: Never   Sexual activity: Yes  Other Topics Concern   Not on file  Social History Narrative   Not on file   Social Determinants of Health   Financial Resource Strain: Low Risk    Difficulty of Paying Living Expenses: Not hard at all  Food Insecurity: No Food Insecurity   Worried About Programme researcher, broadcasting/film/video in the Last Year: Never true   Ran Out of Food in the Last Year: Never true  Transportation Needs: No Transportation Needs   Lack of Transportation (Medical): No   Lack of Transportation (Non-Medical): No  Physical Activity: Inactive   Days of Exercise per Week: 0 days   Minutes of Exercise per Session: 0 min  Stress: No Stress Concern Present   Feeling of Stress : Not at all  Social Connections: Not on file    Tobacco Counseling Counseling given: Not Answered   Clinical Intake:  Pre-visit preparation completed: Yes  Pain : No/denies pain     Nutritional Risks: None Diabetes: No  How often do you need to have someone help you when you read instructions, pamphlets, or other written materials from your doctor or pharmacy?: 1 - Never  Diabetic? no  Interpreter Needed?: No  Information entered by :: NAllen LPN   Activities of Daily  Living In your present state of health, do you have any difficulty performing the following activities: 01/29/2021 01/29/2021  Hearing? N N  Vision? N N  Difficulty concentrating or making decisions? N N  Walking or climbing stairs? N N  Dressing or bathing? N N  Doing errands, shopping? N N  Preparing Food and eating ? N -  Using the Toilet? N -  In the past six months, have you accidently leaked urine? N -  Do you have problems with loss of bowel control? N -  Managing your Medications? N -  Managing your Finances? N -  Housekeeping or managing your Housekeeping? N -  Some recent data might be hidden    Patient Care Team: Arnette Felts, FNP as PCP - General (General Practice) Arnette Felts, FNP (General Practice)  Indicate any recent Medical Services you may have received from other than [facility] providers in the past year (date may be approximate).     Assessment:   This is a routine wellness examination for [place].  Hearing/Vision screen Vision Screening - Comments:: Regular eye exams, [facility]  Dietary issues and exercise activities discussed: Current Exercise Habits: The patient does not participate in regular exercise at present   Goals Addressed             This Visit's Progress    Patient Stated       01/29/2021, get back in to exercise       Depression Screen PHQ 2/9 Scores 01/29/2021 01/29/2021 01/25/2020 01/25/2020 01/19/2019 03/24/2018 12/22/2017  PHQ - 2 Score 0 0 0 0 0 0 0  PHQ- 9 Score - 0 - - 0 - -    Fall Risk Fall Risk  01/29/2021 01/29/2021 01/25/2020 01/25/2020 01/19/2019  Falls in the past year? 0 0 0 0 0  Comment - - - - -  Number falls in past yr: - 0 - - -  Injury with Fall? - 0 - - -  Risk for fall due to : Medication side effect - Medication side effect - Medication side effect  Follow up Falls evaluation completed;Education provided;Falls prevention discussed - Falls evaluation completed;Education provided;Falls prevention discussed -  Falls evaluation completed;Education provided;Falls prevention discussed    FALL RISK PREVENTION PERTAINING TO THE HOME:  Any stairs in or around the home? No  If so, are there any without handrails? N/a Home free of loose throw rugs in walkways, pet beds, electrical cords, etc? Yes  Adequate lighting in your home to reduce risk of falls? Yes   ASSISTIVE DEVICES UTILIZED TO PREVENT FALLS:  Life alert? No  Use of a cane, walker or w/c? No  Grab bars in the bathroom? No  Shower chair or bench in shower? No  Elevated toilet seat or a handicapped toilet? Yes   TIMED UP AND GO:  Was the test performed? No .    Gait steady and fast without use of  assistive device  Cognitive Function:     6CIT Screen 01/29/2021 01/25/2020 01/19/2019 12/22/2017  What Year? 0 points 0 points 0 points 0 points  What month? 0 points 0 points 0 points 0 points  What time? 0 points 0 points 0 points 0 points  Count back from 20 0 points 0 points 0 points 0 points  Months in reverse 0 points 0 points 0 points 0 points  Repeat phrase 2 points 0 points 0 points 0 points  Total Score 2 0 0 0    Immunizations Immunization History  Administered Date(s) Administered   Influenza, High Dose Seasonal PF 11/24/2018, 09/23/2020   Influenza-Unspecified 11/22/2017, 10/25/2019   Moderna Sars-Covid-2 Vaccination 02/24/2019, 03/24/2019, 02/01/2020, 09/23/2020   Pneumococcal Polysaccharide-23 05/22/2020    TDAP status: Up to date  Flu Vaccine status: Up to date  Pneumococcal vaccine status: Up to date  Covid-19 vaccine status: Completed vaccines  Qualifies for Shingles Vaccine? Yes   Zostavax completed No   Shingrix Completed?: No.    Education has been provided regarding the importance of this vaccine. Patient has been advised to call insurance company to determine out of pocket expense if they have not yet received this vaccine. Advised may also receive vaccine at local pharmacy or Health Dept. Verbalized  acceptance and understanding.  Screening Tests Health Maintenance  Topic Date Due   COVID-19 Vaccine (5 - Booster for Moderna series) 11/18/2020   Zoster Vaccines- Shingrix (1 of 2) 03/04/2021 (Originally 06/20/2000)   Pneumonia Vaccine 20+ Years old (2 - PCV) 05/22/2021   TETANUS/TDAP  02/05/2027   COLONOSCOPY (Pts 45-79yrs Insurance coverage will need to be confirmed)  09/15/2028   INFLUENZA VACCINE  Completed   Hepatitis C Screening  Completed   HPV VACCINES  Aged Out    Health Maintenance  Health Maintenance Due  Topic Date Due   COVID-19 Vaccine (5 - Booster for Moderna series) 11/18/2020    Colorectal cancer screening: Type of screening: Colonoscopy. Completed 09/16/2018. Repeat every 10 years  Lung Cancer Screening: (Low Dose CT Chest recommended if Age 61-80 years, 30 pack-year currently smoking OR have quit w/in 15years.) does not qualify.   Lung Cancer Screening Referral: no  Additional Screening:  Hepatitis C Screening: does qualify; Completed 12/22/2017  Vision Screening: Recommended annual ophthalmology exams for early detection of glaucoma and other disorders of the eye. Is the patient up to date with their annual eye exam?  Yes  Who is the provider or what is the name of the office in which the patient attends annual eye exams? [facility] If pt is not established with a provider, would they like to be referred to a provider to establish care? No .   Dental Screening: Recommended annual dental exams for proper oral hygiene  Community Resource Referral / Chronic Care Management: CRR required this visit?  No   CCM required this visit?  No      Plan:     I have personally reviewed and noted the following in the patients chart:   Medical and social history Use of alcohol, tobacco or illicit drugs  Current medications and supplements including opioid prescriptions. Patient is not currently taking opioid prescriptions. Functional ability and status Nutritional  status Physical activity Advanced directives List of other physicians Hospitalizations, surgeries, and ER visits in previous 12 months Vitals Screenings to include cognitive, depression, and falls Referrals and appointments  In addition, I have reviewed and discussed with patient certain preventive protocols, quality metrics, and best practice  recommendations. A written personalized care plan for preventive services as well as general preventive health recommendations were provided to patient.     Barb Merino, LPN   12/45/8099   Nurse Notes: none

## 2021-01-29 NOTE — Progress Notes (Addendum)
I,Tianna Badgett,acting as a Neurosurgeon for [employer], FNP.,have documented all relevant documentation on the behalf of Arnette Felts, FNP,as directed by  Arnette Felts, FNP while in the presence of Arnette Felts, FNP.  This visit occurred during the SARS-CoV-2 public health emergency.  Safety protocols were in place, including screening questions prior to the visit, additional usage of staff PPE, and extensive cleaning of exam room while observing appropriate contact time as indicated for disinfecting solutions.  Subjective:     Patient ID: Alan [name] , male    DOB: [birth year] , 70 y.o.   MRN: [id number]   Chief Complaint  Patient presents with   Hypertension    HPI  Patient here for a blood pressure f/u.  He has not been taking his amlodipine since his last visit. Will occasionally have a 140/60 reading mostly 120's/60's. Denies any current issues or concerns.   AWV done today  Hypertension This is a chronic problem. The current episode started more than 1 year ago. The problem is unchanged. The problem is controlled. Pertinent negatives include no anxiety, chest pain, headaches, palpitations or shortness of breath. Risk factors for coronary artery disease include male gender, sedentary lifestyle and dyslipidemia. Past treatments include calcium channel blockers. The current treatment provides significant improvement. There are no compliance problems.  There is no history of angina or kidney disease. There is no history of chronic renal disease.    Past Medical History:  Diagnosis Date   Cataracts, bilateral    Glaucoma    Renal disorder    kideny stone     History reviewed. No pertinent family history.   Current Outpatient Medications:    Ascorbic Acid (VITAMIN C) 100 MG tablet, Take 100 mg by mouth daily., Disp: , Rfl:    cholecalciferol (VITAMIN D3) 25 MCG (1000 UNIT) tablet, Take 1,000 Units by mouth daily., Disp: , Rfl:    sildenafil (VIAGRA) 100 MG tablet, Take 1 tablet  (100 mg total) by mouth daily as needed for erectile dysfunction., Disp: 10 tablet, Rfl: 3   Timolol Maleate 0.5 % (DAILY) SOLN, Place 1 drop into both eyes at bedtime., Disp: , Rfl:    Travoprost, BAK Free, (TRAVATAN) 0.004 % SOLN ophthalmic solution, Place 1 drop into both eyes at bedtime., Disp: , Rfl:    valACYclovir (VALTREX) 500 MG tablet, Take 1 tablet (500 mg total) by mouth 2 (two) times daily., Disp: 180 tablet, Rfl: 0   zinc gluconate 50 MG tablet, Take 50 mg by mouth daily., Disp: , Rfl:    No Known Allergies   Review of Systems  Constitutional: Negative.   Respiratory: Negative.  Negative for shortness of breath.   Cardiovascular: Negative.  Negative for chest pain and palpitations.  Gastrointestinal: Negative.   Neurological: Negative.  Negative for headaches.    Today's Vitals   01/29/21 1443  BP: 122/68  Pulse: 70  Temp: 98.8 F (37.1 C)  TempSrc: Oral  Weight: 183 lb 9.6 oz (83.3 kg)  Height: 5' 8.6" (1.742 m)   Body mass index is 27.43 kg/m.  Wt Readings from Last 3 Encounters:  01/29/21 183 lb 9.6 oz (83.3 kg)  01/29/21 183 lb (83 kg)  12/02/20 183 lb 12.8 oz (83.4 kg)    Objective:  Physical Exam Vitals reviewed.  Constitutional:      Appearance: Normal appearance.  Cardiovascular:     Pulses: Normal pulses.     Heart sounds: Normal heart sounds. No murmur heard. Neurological:  Mental Status: He is alert.        Assessment And Plan:     1. Essential hypertension Comments: Blood pressure is well controlled, will discontinue amlodipine. He is to spot check his blood pressure and if elevated call to office. I have also changed his pharmacy in Epic, he is to inform us when he needs a refill to send to the correct pharmacy     Patient was given opportunity to ask questions. Patient verbalized understanding of the plan and was able to repeat key elements of the plan. All questions were answered to their satisfaction.  Arnette Felts, FNP   I,  Arnette Felts, FNP, have reviewed all documentation for this visit. The documentation on 01/29/21 for the exam, diagnosis, procedures, and orders are all accurate and complete.   IF YOU HAVE BEEN REFERRED TO A SPECIALIST, IT MAY TAKE 1-2 WEEKS TO SCHEDULE/PROCESS THE REFERRAL. IF YOU HAVE NOT HEARD FROM US/SPECIALIST IN TWO WEEKS, PLEASE GIVE [place] A CALL AT [phone].   THE PATIENT IS ENCOURAGED TO PRACTICE SOCIAL DISTANCING DUE TO THE COVID-19 PANDEMIC.

## 2021-01-29 NOTE — Patient Instructions (Signed)
Alan [name] , Thank you for taking time to come for your Medicare Wellness Visit. I appreciate your ongoing commitment to your health goals. Please review the following plan we discussed and let me know if I can assist you in the future.   Screening recommendations/referrals: Colonoscopy: completed 09/16/2018 Recommended yearly ophthalmology/optometry visit for glaucoma screening and checkup Recommended yearly dental visit for hygiene and checkup  Vaccinations: Influenza vaccine: completed 09/23/2020 Pneumococcal vaccine: completed 05/22/2020 Tdap vaccine: completed 02/04/2017, due 02/05/2027 Shingles vaccine: discussed   Covid-19: 09/23/2020, 02/01/2020,03/24/2019, 02/24/2019  Advanced directives: Advance directive discussed with you today. Even though you declined this today please call our office should you change your mind and we can give you the proper paperwork for you to fill out.  Conditions/risks identified: none  Next appointment: Follow up in one year for your annual wellness visit.   Preventive Care 9 Years and Older, Male Preventive care refers to lifestyle choices and visits with your health care provider that can promote health and wellness. What does preventive care include? A yearly physical exam. This is also called an annual well check. Dental exams once or twice a year. Routine eye exams. Ask your health care provider how often you should have your eyes checked. Personal lifestyle choices, including: Daily care of your teeth and gums. Regular physical activity. Eating a healthy diet. Avoiding tobacco and drug use. Limiting alcohol use. Practicing safe sex. Taking low doses of aspirin every day. Taking vitamin and mineral supplements as recommended by your health care provider. What happens during an annual well check? The services and screenings done by your health care provider during your annual well check will depend on your age, overall health, lifestyle risk  factors, and family history of disease. Counseling  Your health care provider may ask you questions about your: Alcohol use. Tobacco use. Drug use. Emotional well-being. Home and relationship well-being. Sexual activity. Eating habits. History of falls. Memory and ability to understand (cognition). Work and work Astronomer. Screening  You may have the following tests or measurements: Height, weight, and BMI. Blood pressure. Lipid and cholesterol levels. These may be checked every 5 years, or more frequently if you are over 56 years old. Skin check. Lung cancer screening. You may have this screening every year starting at age 65 if you have a 30-pack-year history of smoking and currently smoke or have quit within the past 15 years. Fecal occult blood test (FOBT) of the stool. You may have this test every year starting at age 38. Flexible sigmoidoscopy or colonoscopy. You may have a sigmoidoscopy every 5 years or a colonoscopy every 10 years starting at age 68. Prostate cancer screening. Recommendations will vary depending on your family history and other risks. Hepatitis C blood test. Hepatitis B blood test. Sexually transmitted disease (STD) testing. Diabetes screening. This is done by checking your blood sugar (glucose) after you have not eaten for a while (fasting). You may have this done every 1-3 years. Abdominal aortic aneurysm (AAA) screening. You may need this if you are a current or former smoker. Osteoporosis. You may be screened starting at age 98 if you are at high risk. Talk with your health care provider about your test results, treatment options, and if necessary, the need for more tests. Vaccines  Your health care provider may recommend certain vaccines, such as: Influenza vaccine. This is recommended every year. Tetanus, diphtheria, and acellular pertussis (Tdap, Td) vaccine. You may need a Td booster every 10 years. Zoster vaccine. You  may need this after age  49. Pneumococcal 13-valent conjugate (PCV13) vaccine. One dose is recommended after age 89. Pneumococcal polysaccharide (PPSV23) vaccine. One dose is recommended after age 52. Talk to your health care provider about which screenings and vaccines you need and how often you need them. This information is not intended to replace advice given to you by your health care provider. Make sure you discuss any questions you have with your health care provider. Document Released: 02/22/2015 Document Revised: 10/16/2015 Document Reviewed: 11/27/2014 Elsevier Interactive Patient Education  2017 [place] Prevention in the Home Falls can cause injuries. They can happen to people of all ages. There are many things you can do to make your home safe and to help prevent falls. What can I do on the outside of my home? Regularly fix the edges of walkways and driveways and fix any cracks. Remove anything that might make you trip as you walk through a door, such as a raised step or threshold. Trim any bushes or trees on the path to your home. Use bright outdoor lighting. Clear any walking paths of anything that might make someone trip, such as rocks or tools. Regularly check to see if handrails are loose or broken. Make sure that both sides of any steps have handrails. Any raised decks and porches should have guardrails on the edges. Have any leaves, snow, or ice cleared regularly. Use sand or salt on walking paths during winter. Clean up any spills in your garage right away. This includes oil or grease spills. What can I do in the bathroom? Use night lights. Install grab bars by the toilet and in the tub and shower. Do not use towel bars as grab bars. Use non-skid mats or decals in the tub or shower. If you need to sit down in the shower, use a plastic, non-slip stool. Keep the floor dry. Clean up any water that spills on the floor as soon as it happens. Remove soap buildup in the tub or shower  regularly. Attach bath mats securely with double-sided non-slip rug tape. Do not have throw rugs and other things on the floor that can make you trip. What can I do in the bedroom? Use night lights. Make sure that you have a light by your bed that is easy to reach. Do not use any sheets or blankets that are too big for your bed. They should not hang down onto the floor. Have a firm chair that has side arms. You can use this for support while you get dressed. Do not have throw rugs and other things on the floor that can make you trip. What can I do in the kitchen? Clean up any spills right away. Avoid walking on wet floors. Keep items that you use a lot in easy-to-reach places. If you need to reach something above you, use a strong step stool that has a grab bar. Keep electrical cords out of the way. Do not use floor polish or wax that makes floors slippery. If you must use wax, use non-skid floor wax. Do not have throw rugs and other things on the floor that can make you trip. What can I do with my stairs? Do not leave any items on the stairs. Make sure that there are handrails on both sides of the stairs and use them. Fix handrails that are broken or loose. Make sure that handrails are as long as the stairways. Check any carpeting to make sure that it is firmly  attached to the stairs. Fix any carpet that is loose or worn. Avoid having throw rugs at the top or bottom of the stairs. If you do have throw rugs, attach them to the floor with carpet tape. Make sure that you have a light switch at the top of the stairs and the bottom of the stairs. If you do not have them, ask someone to add them for you. What else can I do to help prevent falls? Wear shoes that: Do not have high heels. Have rubber bottoms. Are comfortable and fit you well. Are closed at the toe. Do not wear sandals. If you use a stepladder: Make sure that it is fully opened. Do not climb a closed stepladder. Make sure that  both sides of the stepladder are locked into place. Ask someone to hold it for you, if possible. Clearly mark and make sure that you can see: Any grab bars or handrails. First and last steps. Where the edge of each step is. Use tools that help you move around (mobility aids) if they are needed. These include: Canes. Walkers. Scooters. Crutches. Turn on the lights when you go into a dark area. Replace any light bulbs as soon as they burn out. Set up your furniture so you have a clear path. Avoid moving your furniture around. If any of your floors are uneven, fix them. If there are any pets around you, be aware of where they are. Review your medicines with your doctor. Some medicines can make you feel dizzy. This can increase your chance of falling. Ask your doctor what other things that you can do to help prevent falls. This information is not intended to replace advice given to you by your health care provider. Make sure you discuss any questions you have with your health care provider. Document Released: 11/22/2008 Document Revised: 07/04/2015 Document Reviewed: 03/02/2014 Elsevier Interactive Patient Education  2017 [employer].

## 2021-02-22 DIAGNOSIS — Z20822 Contact with and (suspected) exposure to covid-19: Secondary | ICD-10-CM | POA: Diagnosis not present

## 2021-08-06 ENCOUNTER — Ambulatory Visit: Payer: Medicare Other | Admitting: Nurse Practitioner

## 2021-10-27 ENCOUNTER — Telehealth: Payer: Self-pay

## 2021-10-27 ENCOUNTER — Other Ambulatory Visit: Payer: Self-pay

## 2021-10-27 NOTE — Telephone Encounter (Signed)
Looked at dispense hx for medications.

## 2021-10-29 ENCOUNTER — Ambulatory Visit (INDEPENDENT_AMBULATORY_CARE_PROVIDER_SITE_OTHER): Payer: Medicare Other | Admitting: Nurse Practitioner

## 2021-10-29 ENCOUNTER — Encounter: Payer: Self-pay | Admitting: Nurse Practitioner

## 2021-10-29 VITALS — BP 122/70 | HR 64 | Temp 98.0°F | Ht 68.6 in | Wt 184.0 lb

## 2021-10-29 DIAGNOSIS — E7800 Pure hypercholesterolemia, unspecified: Secondary | ICD-10-CM

## 2021-10-29 DIAGNOSIS — I10 Essential (primary) hypertension: Secondary | ICD-10-CM | POA: Diagnosis not present

## 2021-10-29 DIAGNOSIS — Z23 Encounter for immunization: Secondary | ICD-10-CM | POA: Diagnosis not present

## 2021-10-29 DIAGNOSIS — Z8249 Family history of ischemic heart disease and other diseases of the circulatory system: Secondary | ICD-10-CM

## 2021-10-29 MED ORDER — AMLODIPINE BESYLATE 5 MG PO TABS: 5.0000 mg | ORAL_TABLET | Freq: Every day | ORAL | 1 refills | Status: DC

## 2021-10-29 NOTE — Progress Notes (Signed)
I,Tianna Badgett,acting as a Neurosurgeon for [employer], FNP.,have documented all relevant documentation on the behalf of Arnette Felts, FNP,as directed by  Arnette Felts, FNP while in the presence of Arnette Felts, FNP.  Subjective:     Patient ID: Alan [name] , male    DOB: July 26, 1950 , 71 y.o.   MRN: [id number]   Chief Complaint  Patient presents with   Hypertension    HPI  Patient here for a blood pressure f/u.  His sister was diagnosed with Hypertropic cardiomyopathy in February 2023. He does admit to stopping his blood pressure medications for a while due to being low and taking natural supplements. Was up in 150-160/90's but restarted 2 weeks ago. In the last 30 days has not been checked his blood sugar.   Hypertension This is a chronic problem. The current episode started more than 1 year ago. The problem is unchanged. The problem is controlled. Pertinent negatives include no anxiety, chest pain, headaches, palpitations or shortness of breath. Risk factors for coronary artery disease include male gender, sedentary lifestyle and dyslipidemia. Past treatments include calcium channel blockers. The current treatment provides significant improvement. There are no compliance problems.  There is no history of angina or kidney disease. There is no history of chronic renal disease.     Past Medical History:  Diagnosis Date   Cataracts, bilateral    Glaucoma    Renal disorder    kideny stone     History reviewed. No pertinent family history.   Current Outpatient Medications:    amLODipine (NORVASC) 5 MG tablet, Take 1 tablet (5 mg total) by mouth daily., Disp: 90 tablet, Rfl: 1   Ascorbic Acid (VITAMIN C) 100 MG tablet, Take 100 mg by mouth daily., Disp: , Rfl:    cholecalciferol (VITAMIN D3) 25 MCG (1000 UNIT) tablet, Take 1,000 Units by mouth daily., Disp: , Rfl:    sildenafil (VIAGRA) 100 MG tablet, Take 1 tablet (100 mg total) by mouth daily as needed for erectile dysfunction.,  Disp: 10 tablet, Rfl: 3   Timolol Maleate 0.5 % (DAILY) SOLN, Place 1 drop into both eyes at bedtime., Disp: , Rfl:    Travoprost, BAK Free, (TRAVATAN) 0.004 % SOLN ophthalmic solution, Place 1 drop into both eyes at bedtime., Disp: , Rfl:    valACYclovir (VALTREX) 500 MG tablet, Take 1 tablet (500 mg total) by mouth 2 (two) times daily., Disp: 180 tablet, Rfl: 0   zinc gluconate 50 MG tablet, Take 50 mg by mouth daily., Disp: , Rfl:    No Known Allergies   Review of Systems  Constitutional: Negative.   Respiratory: Negative.  Negative for shortness of breath.   Cardiovascular: Negative.  Negative for chest pain and palpitations.  Gastrointestinal: Negative.   Neurological: Negative.  Negative for headaches.  Psychiatric/Behavioral: Negative.       Today's Vitals   10/29/21 1459  BP: 122/70  Pulse: 64  Temp: 98 F (36.7 C)  TempSrc: Oral  Weight: 184 lb (83.5 kg)  Height: 5' 8.6" (1.742 m)   Body mass index is 27.49 kg/m.   Objective:  Physical Exam Vitals reviewed.  Constitutional:      Appearance: Normal appearance.  Cardiovascular:     Pulses: Normal pulses.     Heart sounds: Normal heart sounds. No murmur heard. Pulmonary:     Effort: Pulmonary effort is normal. No respiratory distress.     Breath sounds: Normal breath sounds. No wheezing.  Neurological:     General:  No focal deficit present.     Mental Status: He is alert and oriented to person, place, and time.     Cranial Nerves: No cranial nerve deficit.     Motor: No weakness.  Psychiatric:        Mood and Affect: Mood normal.        Behavior: Behavior normal.        Thought Content: Thought content normal.        Judgment: Judgment normal.         Assessment And Plan:     1. Essential hypertension Comments: he is to take 1/2 tab amlodipine daily if normal at next appt will consider discontinuing.  - BMP8+EGFR  2. Need for influenza vaccination Influenza vaccine administered Encouraged to take  Tylenol as needed for fever or muscle aches. - Flu Vaccine QUAD High Dose(Fluad)  3. Need for pneumococcal vaccination Pneumonia given in office. - Pneumococcal conjugate vaccine 20-valent (Prevnar 20)  4. Family history of hypertrophic cardiomyopathy Sister recently diagnosed earlier this year and would like to see Cardiology for evaluation - Ambulatory referral to Cardiology  5. Elevated LDL cholesterol level Comments: Cholesterol levels are improving. Diet controlled. - Lipid panel     Patient was given opportunity to ask questions. Patient verbalized understanding of the plan and was able to repeat key elements of the plan. All questions were answered to their satisfaction.  Arnette Felts, FNP   I, Arnette Felts, FNP, have reviewed all documentation for this visit. The documentation on 10/29/21 for the exam, diagnosis, procedures, and orders are all accurate and complete.   IF YOU HAVE BEEN REFERRED TO A SPECIALIST, IT MAY TAKE 1-2 WEEKS TO SCHEDULE/PROCESS THE REFERRAL. IF YOU HAVE NOT HEARD FROM US/SPECIALIST IN TWO WEEKS, PLEASE GIVE [place] A CALL AT [phone].   THE PATIENT IS ENCOURAGED TO PRACTICE SOCIAL DISTANCING DUE TO THE COVID-19 PANDEMIC.

## 2021-10-29 NOTE — Patient Instructions (Signed)
Hypertension, Adult High blood pressure (hypertension) is when the force of blood pumping through the arteries is too strong. The arteries are the blood vessels that carry blood from the heart throughout the body. Hypertension forces the heart to work harder to pump blood and may cause arteries to become narrow or stiff. Untreated or uncontrolled hypertension can lead to a heart attack, heart failure, a stroke, kidney disease, and other problems. A blood pressure reading consists of a higher number over a lower number. Ideally, your blood pressure should be below 120/80. The first ("top") number is called the systolic pressure. It is a measure of the pressure in your arteries as your heart beats. The second ("bottom") number is called the diastolic pressure. It is a measure of the pressure in your arteries as the heart relaxes. What are the causes? The exact cause of this condition is not known. There are some conditions that result in high blood pressure. What increases the risk? Certain factors may make you more likely to develop high blood pressure. Some of these risk factors are under your control, including: Smoking. Not getting enough exercise or physical activity. Being overweight. Having too much fat, sugar, calories, or salt (sodium) in your diet. Drinking too much alcohol. Other risk factors include: Having a personal history of heart disease, diabetes, high cholesterol, or kidney disease. Stress. Having a family history of high blood pressure and high cholesterol. Having obstructive sleep apnea. Age. The risk increases with age. What are the signs or symptoms? High blood pressure may not cause symptoms. Very high blood pressure (hypertensive crisis) may cause: Headache. Fast or irregular heartbeats (palpitations). Shortness of breath. Nosebleed. Nausea and vomiting. Vision changes. Severe chest pain, dizziness, and seizures. How is this diagnosed? This condition is diagnosed by  measuring your blood pressure while you are seated, with your arm resting on a flat surface, your legs uncrossed, and your feet flat on the floor. The cuff of the blood pressure monitor will be placed directly against the skin of your upper arm at the level of your heart. Blood pressure should be measured at least twice using the same arm. Certain conditions can cause a difference in blood pressure between your right and left arms. If you have a high blood pressure reading during one visit or you have normal blood pressure with other risk factors, you may be asked to: Return on a different day to have your blood pressure checked again. Monitor your blood pressure at home for 1 week or longer. If you are diagnosed with hypertension, you may have other blood or imaging tests to help your health care provider understand your overall risk for other conditions. How is this treated? This condition is treated by making healthy lifestyle changes, such as eating healthy foods, exercising more, and reducing your alcohol intake. You may be referred for counseling on a healthy diet and physical activity. Your health care provider may prescribe medicine if lifestyle changes are not enough to get your blood pressure under control and if: Your systolic blood pressure is above 130. Your diastolic blood pressure is above 80. Your personal target blood pressure may vary depending on your medical conditions, your age, and other factors. Follow these instructions at home: Eating and drinking  Eat a diet that is high in fiber and potassium, and low in sodium, added sugar, and fat. An example of this eating plan is called the DASH diet. DASH stands for Dietary Approaches to Stop Hypertension. To eat this way: Eat   plenty of fresh fruits and vegetables. Try to fill one half of your plate at each meal with fruits and vegetables. Eat whole grains, such as whole-wheat pasta, brown rice, or whole-grain bread. Fill about one  fourth of your plate with whole grains. Eat or drink low-fat dairy products, such as skim milk or low-fat yogurt. Avoid fatty cuts of meat, processed or cured meats, and poultry with skin. Fill about one fourth of your plate with lean proteins, such as fish, chicken without skin, beans, eggs, or tofu. Avoid pre-made and processed foods. These tend to be higher in sodium, added sugar, and fat. Reduce your daily sodium intake. Many people with hypertension should eat less than 1,500 mg of sodium a day. Do not drink alcohol if: Your health care provider tells you not to drink. You are pregnant, may be pregnant, or are planning to become pregnant. If you drink alcohol: Limit how much you have to: 0-1 drink a day for women. 0-2 drinks a day for men. Know how much alcohol is in your drink. In the [place] one drink equals one 12 oz bottle of beer (355 mL), one 5 oz glass of wine (148 mL), or one 1 oz glass of hard liquor (44 mL). Lifestyle  Work with your health care provider to maintain a healthy body weight or to lose weight. Ask what an ideal weight is for you. Get at least 30 minutes of exercise that causes your heart to beat faster (aerobic exercise) most days of the week. Activities may include walking, swimming, or biking. Include exercise to strengthen your muscles (resistance exercise), such as Pilates or lifting weights, as part of your weekly exercise routine. Try to do these types of exercises for 30 minutes at least 3 days a week. Do not use any products that contain nicotine or tobacco. These products include cigarettes, chewing tobacco, and vaping devices, such as e-cigarettes. If you need help quitting, ask your health care provider. Monitor your blood pressure at home as told by your health care provider. Keep all follow-up visits. This is important. Medicines Take over-the-counter and prescription medicines only as told by your health care provider. Follow directions carefully. Blood  pressure medicines must be taken as prescribed. Do not skip doses of blood pressure medicine. Doing this puts you at risk for problems and can make the medicine less effective. Ask your health care provider about side effects or reactions to medicines that you should watch for. Contact a health care provider if you: Think you are having a reaction to a medicine you are taking. Have headaches that keep coming back (recurring). Feel dizzy. Have swelling in your ankles. Have trouble with your vision. Get help right away if you: Develop a severe headache or confusion. Have unusual weakness or numbness. Feel faint. Have severe pain in your chest or abdomen. Vomit repeatedly. Have trouble breathing. These symptoms may be an emergency. Get help right away. Call 911. Do not wait to see if the symptoms will go away. Do not drive yourself to the hospital. Summary Hypertension is when the force of blood pumping through your arteries is too strong. If this condition is not controlled, it may put you at risk for serious complications. Your personal target blood pressure may vary depending on your medical conditions, your age, and other factors. For most people, a normal blood pressure is less than 120/80. Hypertension is treated with lifestyle changes, medicines, or a combination of both. Lifestyle changes include losing weight, eating a healthy,   low-sodium diet, exercising more, and limiting alcohol. This information is not intended to replace advice given to you by your health care provider. Make sure you discuss any questions you have with your health care provider. Document Revised: 12/03/2020 Document Reviewed: 12/03/2020 Elsevier Patient Education  2023 Elsevier Inc.  

## 2021-10-30 LAB — BMP8+EGFR
BUN/Creatinine Ratio: 11 (ref 10–24)
BUN: 12 mg/dL (ref 8–27)
CO2: 22 mmol/L (ref 20–29)
Calcium: 9.2 mg/dL (ref 8.6–10.2)
Chloride: 103 mmol/L (ref 96–106)
Creatinine, Ser: 1.14 mg/dL (ref 0.76–1.27)
Glucose: 89 mg/dL (ref 70–99)
Potassium: 4.5 mmol/L (ref 3.5–5.2)
Sodium: 140 mmol/L (ref 134–144)
eGFR: 69 mL/min/{1.73_m2} (ref >59)

## 2021-10-30 LAB — LIPID PANEL
Chol/HDL Ratio: 4.2 ratio (ref 0.0–5.0)
Cholesterol, Total: 196 mg/dL (ref 100–199)
HDL: 47 mg/dL (ref >39)
LDL Chol Calc (NIH): 136 mg/dL — ABNORMAL HIGH (ref 0–99)
Triglycerides: 72 mg/dL (ref 0–149)
VLDL Cholesterol Cal: 13 mg/dL (ref 5–40)

## 2021-12-15 ENCOUNTER — Ambulatory Visit (INDEPENDENT_AMBULATORY_CARE_PROVIDER_SITE_OTHER): Payer: Medicare Other | Admitting: Cardiovascular Disease

## 2021-12-15 ENCOUNTER — Encounter (HOSPITAL_BASED_OUTPATIENT_CLINIC_OR_DEPARTMENT_OTHER): Payer: Self-pay | Admitting: Cardiovascular Disease

## 2021-12-15 VITALS — BP 124/68 | HR 54 | Ht 70.5 in | Wt 184.0 lb

## 2021-12-15 DIAGNOSIS — I10 Essential (primary) hypertension: Secondary | ICD-10-CM

## 2021-12-15 DIAGNOSIS — Z8249 Family history of ischemic heart disease and other diseases of the circulatory system: Secondary | ICD-10-CM | POA: Diagnosis not present

## 2021-12-15 DIAGNOSIS — E7800 Pure hypercholesterolemia, unspecified: Secondary | ICD-10-CM

## 2021-12-15 HISTORY — DX: Family history of ischemic heart disease and other diseases of the circulatory system: Z82.49

## 2021-12-15 NOTE — Assessment & Plan Note (Signed)
Blood pressure is very well controlled on amlodipine.  Keep working on diet and increase exercise to at least 150 minutes weekly.  Blood pressure goal is less than 130/80.

## 2021-12-15 NOTE — Assessment & Plan Note (Signed)
His older sister was recently diagnosed with hypertrophic cardiomyopathy.  He has no murmur on exam and no heart failure symptoms.  No hypertrophy noted on EKG.  He has not had any palpitations and has no family history of sudden cardiac death.  We will get an echocardiogram to better assess, but my suspicion is low.

## 2021-12-15 NOTE — Patient Instructions (Signed)
Medication Instructions:  Your physician recommends that you continue on your current medications as directed. Please refer to the Current Medication list given to you today.   *If you need a refill on your cardiac medications before your next appointment, please call your pharmacy*  Lab Work: FASTING LP/CMET IN 3 MONHTS  If you have labs (blood work) drawn today and your tests are completely normal, you will receive your results only by: [place] (if you have MyChart) OR A paper copy in the mail If you have any lab test that is abnormal or we need to change your treatment, we will call you to review the results.  Testing/Procedures: Your physician has requested that you have an echocardiogram. Echocardiography is a painless test that uses sound waves to create images of your heart. It provides your doctor with information about the size and shape of your heart and how well your heart's chambers and valves are working. This procedure takes approximately one hour. There are no restrictions for this procedure. Please do NOT wear cologne, perfume, aftershave, or lotions (deodorant is allowed). Please arrive 15 minutes prior to your appointment time.  CALCIUM SCORE-THIS WILL COST YOU $99 OUT OF POCKET   Follow-Up: At [facility], you and your health needs are our priority.  As part of our continuing mission to provide you with exceptional heart care, we have created designated Provider Care Teams.  These Care Teams include your primary Cardiologist (physician) and Advanced Practice Providers (APPs -  Physician Assistants and Nurse Practitioners) who all work together to provide you with the care you need, when you need it.  We recommend signing up for the patient portal called "MyChart".  Sign up information is provided on this After Visit Summary.  MyChart is used to connect with patients for Virtual Visits (Telemedicine).  Patients are able to view lab/test results, encounter  notes, upcoming appointments, etc.  Non-urgent messages can be sent to your provider as well.   To learn more about what you can do with MyChart, go to [email or web address].    Your next appointment:   AFTER LABS IN 3 month(s)  The format for your next appointment:   In Person  Provider:   Skeet Latch, MD   Other Instructions Exercise recommendations: The American Heart Association recommends 150 minutes of moderate intensity exercise weekly. Try 30 minutes of moderate intensity exercise 4-5 times per week. This could include walking, jogging, or swimming.  WORK ON YOUR DIET

## 2021-12-15 NOTE — Assessment & Plan Note (Signed)
ASCVD 10-year risk is 18.8%.  We will discuss cholesterol medication and he would like to try to avoid medicine if at all possible.  Recommended working on increasing exercise and working on diet as above.  Repeat lipids and a CMP in 3 months.  We will also get a coronary calcium score to better understand his true risk.

## 2021-12-15 NOTE — Progress Notes (Signed)
Cardiology Office Note:    Date:  12/15/2021   ID:  Milda Smart, DOB 07/17/1950, MRN [id number]  PCP:  Arnette Felts, FNP   [facility] Providers Cardiologist:  Chilton Si, MD     Referring MD: Arnette Felts, FNP   No chief complaint on file.   History of Present Illness:    Alan [name] is a 71 y.o. male with a hx of hypertension here for family history of hypertrophic cardiomyopathy.  He saw his PCP 10/2021 and had been off his blood pressure medication in favor of supplements. Blood pressures were in the 150s to 160s so he resumed his medicines. He noted that his sister was recently diagnosed with HCM, so he was referred to cardiology. At that visit, his blood pressure was 122/70 on amlodipine.   Today, he says he has been feeling fine. He states that occasionally he becomes mildly lightheaded after bending over. This usually occurs when he is outside. Additionally, he breaks out in hives about once or twice per month. This has been occurring for the past several months. These hives only last for several hours, and present mainly on his torso, arms, and legs. He states that he has not changed his eating habits or detergent. In regards to exercise, he has not been performing much formal exercise. He works in the yard for some of his main activity. He smoked many years ago around the early 1980s. For his diet, he mainly eats at home, consuming raw or boiled foods. He does not eat a lot of fried foods, but will occasionally have fish or fried chicken once or twice a month. He states he smoked around 6-7 cigarettes per day. 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 He had a stress test performed and everything looked normal. He denies any palpitations, chest pain, shortness of breath, or peripheral edema. No headaches, syncope, orthopnea, or PND.  His mother had diabetes,  stroke, dementia, and a couple heart attacks. She died at 21. Her heart attacks happened later in life. His father passed away from prostate cancer a few years before his mother. His paternal grandfather died of a heart attack in his 4s. His brother had colon cancer that was treated about four years ago. His youngest sister has diabetes. His older sisters also have diabetes. One of his older sisters has HCM.   Past Medical History:  Diagnosis Date   Cataracts, bilateral    Family history of hypertrophic cardiomyopathy 12/15/2021   Glaucoma    Renal disorder    kideny stone    History reviewed. No pertinent surgical history.  Current Medications: Current Meds  Medication Sig   amLODipine (NORVASC) 5 MG tablet Take 1 tablet (5 mg total) by mouth daily.   Ascorbic Acid (VITAMIN C) 100 MG tablet Take 100 mg by mouth daily.   cholecalciferol (VITAMIN D3) 25 MCG (1000 UNIT) tablet Take 1,000 Units by mouth daily.   sildenafil (VIAGRA) 100 MG tablet Take 1 tablet (100 mg total) by mouth daily as needed for erectile dysfunction.   Timolol Maleate 0.5 % (DAILY) SOLN Place 1 drop into both eyes at bedtime.   Travoprost, BAK Free, (TRAVATAN) 0.004 % SOLN ophthalmic solution Place 1 drop into both eyes at bedtime.   valACYclovir (VALTREX) 500 MG tablet Take 1 tablet (500 mg total) by mouth 2 (two) times daily.  zinc gluconate 50 MG tablet Take 50 mg by mouth daily.     Allergies:   Patient has no known allergies.   Social History   Socioeconomic History   Marital status: Married    Spouse name: Not on file   Number of children: Not on file   Years of education: Not on file   Highest education level: Not on file  Occupational History   Not on file  Tobacco Use   Smoking status: Former    Packs/day: 0.25    Types: Cigarettes    Quit date: 02/09/1981    Years since quitting: 40.8   Smokeless tobacco: Never  Vaping Use   Vaping Use: Never used  Substance and Sexual Activity   Alcohol  use: Yes    Comment: socially   Drug use: Never   Sexual activity: Yes  Other Topics Concern   Not on file  Social History Narrative   Not on file   Social Determinants of Health   Financial Resource Strain: Low Risk  (01/29/2021)   Overall Financial Resource Strain (CARDIA)    Difficulty of Paying Living Expenses: Not hard at all  Food Insecurity: No Food Insecurity (01/29/2021)   Hunger Vital Sign    Worried About Running Out of Food in the Last Year: Never true    Ran Out of Food in the Last Year: Never true  Transportation Needs: No Transportation Needs (01/29/2021)   PRAPARE - Administrator, Civil Service (Medical): No    Lack of Transportation (Non-Medical): No  Physical Activity: Inactive (01/29/2021)   Exercise Vital Sign    Days of Exercise per Week: 0 days    Minutes of Exercise per Session: 0 min  Stress: No Stress Concern Present (01/29/2021)   [name] of Occupational Health - Occupational Stress Questionnaire    Feeling of Stress : Not at all  Social Connections: Not on file     Family History: The patient's family history includes Colon cancer in his brother; Dementia in his mother; Diabetes in his mother, sister, and sister; Heart attack in his maternal grandfather and mother; Heart disease in his mother; Hypertrophic cardiomyopathy in his sister; Prostate cancer in his father; Stroke in his mother.  ROS:   Please see the history of present illness.    (+) Mild occasional lightheadedness   (+) Occasional hives All other systems reviewed and are negative.  EKGs/Labs/Other Studies Reviewed:    The following studies were reviewed today: No prior cardiovascular studies.   EKG: EKG is personally reviewed. 12/15/21: Sinus bradycardia. Rate 54 bpm.    Recent Labs: 10/29/2021: BUN 12; Creatinine, Ser 1.14; Potassium 4.5; Sodium 140  Recent Lipid Panel    Component Value Date/Time   CHOL 196 10/29/2021 1602   TRIG 72 10/29/2021 1602    HDL 47 10/29/2021 1602   CHOLHDL 4.2 10/29/2021 1602   LDLCALC 136 (H) 10/29/2021 1602     Risk Assessment/Calculations:                Physical Exam:    VS:  BP 124/68   Pulse (!) 54   Ht 5' 10.5" (1.791 m)   Wt 184 lb (83.5 kg)   BMI 26.03 kg/m     Wt Readings from Last 3 Encounters:  12/15/21 184 lb (83.5 kg)  10/29/21 184 lb (83.5 kg)  01/29/21 183 lb 9.6 oz (83.3 kg)    VS:  BP 124/68   Pulse (!) 54  Ht 5' 10.5" (1.791 m)   Wt 184 lb (83.5 kg)   BMI 26.03 kg/m  , BMI Body mass index is 26.03 kg/m. GENERAL:  Well appearing HEENT: Pupils equal round and reactive, fundi not visualized, oral mucosa unremarkable NECK:  No jugular venous distention, waveform within normal limits, carotid upstroke brisk and symmetric, no bruits, no thyromegaly LUNGS:  Clear to auscultation bilaterally HEART:  RRR.  PMI not displaced or sustained,S1 and S2 within normal limits, no S3, no S4, no clicks, no rubs, no murmurs ABD:  Flat, positive bowel sounds normal in frequency in pitch, no bruits, no rebound, no guarding, no midline pulsatile mass, no hepatomegaly, no splenomegaly EXT:  2 plus pulses throughout, no edema, no cyanosis no clubbing SKIN:  No rashes no nodules NEURO:  Cranial nerves II through XII grossly intact, motor grossly intact throughout PSYCH:  Cognitively intact, oriented to person place and time    ASSESSMENT:    1. Essential hypertension   2. Pure hypercholesterolemia   3. Family history of hypertrophic cardiomyopathy     PLAN:    Essential hypertension Blood pressure is very well controlled on amlodipine.  Keep working on diet and increase exercise to at least 150 minutes weekly.  Blood pressure goal is less than 130/80.  Pure hypercholesterolemia ASCVD 10-year risk is 18.8%.  We will discuss cholesterol medication and he would like to try to avoid medicine if at all possible.  Recommended working on increasing exercise and working on diet as above.   Repeat lipids and a CMP in 3 months.  We will also get a coronary calcium score to better understand his true risk.  Family history of hypertrophic cardiomyopathy His older sister was recently diagnosed with hypertrophic cardiomyopathy.  He has no murmur on exam and no heart failure symptoms.  No hypertrophy noted on EKG.  He has not had any palpitations and has no family history of sudden cardiac death.  We will get an echocardiogram to better assess, but my suspicion is low.   Disposition: FU with Mel Langan C. Duke Salvia, MD, [facility] in 3 months.  Medication Adjustments/Labs and Tests Ordered: Current medicines are reviewed at length with the patient today.  Concerns regarding medicines are outlined above.  Orders Placed This Encounter  Procedures   CT CARDIAC SCORING (SELF PAY ONLY)   Lipid panel   Comprehensive metabolic panel   EKG 12-Lead   ECHOCARDIOGRAM COMPLETE   No orders of the defined types were placed in this encounter.   Patient Instructions  Medication Instructions:  Your physician recommends that you continue on your current medications as directed. Please refer to the Current Medication list given to you today.   *If you need a refill on your cardiac medications before your next appointment, please call your pharmacy*  Lab Work: FASTING LP/CMET IN 3 MONHTS  If you have labs (blood work) drawn today and your tests are completely normal, you will receive your results only by: MyChart Message (if you have MyChart) OR A paper copy in the mail If you have any lab test that is abnormal or we need to change your treatment, we will call you to review the results.  Testing/Procedures: Your physician has requested that you have an echocardiogram. Echocardiography is a painless test that uses sound waves to create images of your heart. It provides your doctor with information about the size and shape of your heart and how well your heart's chambers and valves are working. This  procedure takes approximately  one hour. There are no restrictions for this procedure. Please do NOT wear cologne, perfume, aftershave, or lotions (deodorant is allowed). Please arrive 15 minutes prior to your appointment time.  CALCIUM SCORE-THIS WILL COST YOU $99 OUT OF POCKET   Follow-Up: At [facility], you and your health needs are our priority.  As part of our continuing mission to provide you with exceptional heart care, we have created designated Provider Care Teams.  These Care Teams include your primary Cardiologist (physician) and Advanced Practice Providers (APPs -  Physician Assistants and Nurse Practitioners) who all work together to provide you with the care you need, when you need it.  We recommend signing up for the patient portal called "MyChart".  Sign up information is provided on this After Visit Summary.  MyChart is used to connect with patients for Virtual Visits (Telemedicine).  Patients are able to view lab/test results, encounter notes, upcoming appointments, etc.  Non-urgent messages can be sent to your provider as well.   To learn more about what you can do with MyChart, go to [email or web address].    Your next appointment:   AFTER LABS IN 3 month(s)  The format for your next appointment:   In Person  Provider:   Chilton Si, MD   Other Instructions Exercise recommendations: The American Heart Association recommends 150 minutes of moderate intensity exercise weekly. Try 30 minutes of moderate intensity exercise 4-5 times per week. This could include walking, jogging, or swimming.  WORK ON YOUR DIET        I,Breanna Adamick,acting as a scribe for Chilton Si, MD.,have documented all relevant documentation on the behalf of Chilton Si, MD,as directed by  Chilton Si, MD while in the presence of Chilton Si, MD.  I, Robt Okuda C. Duke Salvia, MD have reviewed all documentation for this visit.  The documentation of the exam,  diagnosis, procedures, and orders on 12/15/2021 are all accurate and complete.   Signed, Chilton Si, MD  12/15/2021 11:17 AM    [facility]

## 2021-12-26 ENCOUNTER — Ambulatory Visit (INDEPENDENT_AMBULATORY_CARE_PROVIDER_SITE_OTHER): Payer: Medicare Other

## 2021-12-26 DIAGNOSIS — I10 Essential (primary) hypertension: Secondary | ICD-10-CM

## 2021-12-26 DIAGNOSIS — Z8249 Family history of ischemic heart disease and other diseases of the circulatory system: Secondary | ICD-10-CM | POA: Diagnosis not present

## 2021-12-26 LAB — ECHOCARDIOGRAM COMPLETE
Area-P 1/2: 3.48 cm2
MV M vel: 2.34 m/s
MV Peak grad: 21.9 mmHg
S' Lateral: 2.64 cm

## 2022-01-30 ENCOUNTER — Ambulatory Visit (HOSPITAL_BASED_OUTPATIENT_CLINIC_OR_DEPARTMENT_OTHER)
Admission: RE | Admit: 2022-01-30 | Discharge: 2022-01-30 | Disposition: A | Discharge status: Home / Self Care | Payer: Medicare Other | Source: Ambulatory Visit | Attending: Cardiovascular Disease | Admitting: Cardiovascular Disease

## 2022-01-30 DIAGNOSIS — I10 Essential (primary) hypertension: Secondary | ICD-10-CM | POA: Insufficient documentation

## 2022-01-30 DIAGNOSIS — Z8249 Family history of ischemic heart disease and other diseases of the circulatory system: Secondary | ICD-10-CM | POA: Insufficient documentation

## 2022-01-30 DIAGNOSIS — E7800 Pure hypercholesterolemia, unspecified: Secondary | ICD-10-CM | POA: Insufficient documentation

## 2022-02-10 ENCOUNTER — Telehealth (HOSPITAL_BASED_OUTPATIENT_CLINIC_OR_DEPARTMENT_OTHER): Payer: Self-pay | Admitting: *Deleted

## 2022-02-10 NOTE — Telephone Encounter (Signed)
Advised patient of lab results Patient would like to work on diet and exercise before starting cholesterol lowering medication  Mailed lab orders

## 2022-02-10 NOTE — Telephone Encounter (Signed)
-----   Message from Loel Dubonnet, NP sent at 02/07/2022  1:51 PM EST ----- Coronary calcium score of 5.2 placing him in 34th percentile. This means there is mild plaque in the heart. Recommend prevent it from progressing by following heart healthy diet, regular exercise, and medical therapy. Start Aspirin EC 81mg  daily.  Ideally, would be on cholesterol medication such as statin to prevent progression and stabilize plaque. He may proceed with labs in 3 months and discuss medication at follow up. If he would like to start medical therapy, start Rosuvastatin 10mg  QD.

## 2022-02-26 ENCOUNTER — Ambulatory Visit (INDEPENDENT_AMBULATORY_CARE_PROVIDER_SITE_OTHER): Payer: Medicare Other | Admitting: Nurse Practitioner

## 2022-02-26 ENCOUNTER — Encounter: Payer: Self-pay | Admitting: Nurse Practitioner

## 2022-02-26 ENCOUNTER — Ambulatory Visit (INDEPENDENT_AMBULATORY_CARE_PROVIDER_SITE_OTHER): Payer: Medicare Other

## 2022-02-26 VITALS — BP 122/60 | HR 75 | Temp 97.5°F | Ht 68.6 in | Wt 182.0 lb

## 2022-02-26 VITALS — BP 122/60 | HR 75 | Temp 97.5°F | Ht 68.6 in | Wt 182.8 lb

## 2022-02-26 DIAGNOSIS — Z0000 Encounter for general adult medical examination without abnormal findings: Secondary | ICD-10-CM

## 2022-02-26 DIAGNOSIS — I10 Essential (primary) hypertension: Secondary | ICD-10-CM | POA: Diagnosis not present

## 2022-02-26 DIAGNOSIS — E7800 Pure hypercholesterolemia, unspecified: Secondary | ICD-10-CM

## 2022-02-26 DIAGNOSIS — Z8616 Personal history of COVID-19: Secondary | ICD-10-CM | POA: Diagnosis not present

## 2022-02-26 NOTE — Patient Instructions (Signed)
Hypertension, Adult High blood pressure (hypertension) is when the force of blood pumping through the arteries is too strong. The arteries are the blood vessels that carry blood from the heart throughout the body. Hypertension forces the heart to work harder to pump blood and may cause arteries to become narrow or stiff. Untreated or uncontrolled hypertension can lead to a heart attack, heart failure, a stroke, kidney disease, and other problems. A blood pressure reading consists of a higher number over a lower number. Ideally, your blood pressure should be below 120/80. The first ("top") number is called the systolic pressure. It is a measure of the pressure in your arteries as your heart beats. The second ("bottom") number is called the diastolic pressure. It is a measure of the pressure in your arteries as the heart relaxes. What are the causes? The exact cause of this condition is not known. There are some conditions that result in high blood pressure. What increases the risk? Certain factors may make you more likely to develop high blood pressure. Some of these risk factors are under your control, including: Smoking. Not getting enough exercise or physical activity. Being overweight. Having too much fat, sugar, calories, or salt (sodium) in your diet. Drinking too much alcohol. Other risk factors include: Having a personal history of heart disease, diabetes, high cholesterol, or kidney disease. Stress. Having a family history of high blood pressure and high cholesterol. Having obstructive sleep apnea. Age. The risk increases with age. What are the signs or symptoms? High blood pressure may not cause symptoms. Very high blood pressure (hypertensive crisis) may cause: Headache. Fast or irregular heartbeats (palpitations). Shortness of breath. Nosebleed. Nausea and vomiting. Vision changes. Severe chest pain, dizziness, and seizures. How is this diagnosed? This condition is diagnosed by  measuring your blood pressure while you are seated, with your arm resting on a flat surface, your legs uncrossed, and your feet flat on the floor. The cuff of the blood pressure monitor will be placed directly against the skin of your upper arm at the level of your heart. Blood pressure should be measured at least twice using the same arm. Certain conditions can cause a difference in blood pressure between your right and left arms. If you have a high blood pressure reading during one visit or you have normal blood pressure with other risk factors, you may be asked to: Return on a different day to have your blood pressure checked again. Monitor your blood pressure at home for 1 week or longer. If you are diagnosed with hypertension, you may have other blood or imaging tests to help your health care provider understand your overall risk for other conditions. How is this treated? This condition is treated by making healthy lifestyle changes, such as eating healthy foods, exercising more, and reducing your alcohol intake. You may be referred for counseling on a healthy diet and physical activity. Your health care provider may prescribe medicine if lifestyle changes are not enough to get your blood pressure under control and if: Your systolic blood pressure is above 130. Your diastolic blood pressure is above 80. Your personal target blood pressure may vary depending on your medical conditions, your age, and other factors. Follow these instructions at home: Eating and drinking  Eat a diet that is high in fiber and potassium, and low in sodium, added sugar, and fat. An example of this eating plan is called the DASH diet. DASH stands for Dietary Approaches to Stop Hypertension. To eat this way: Eat   plenty of fresh fruits and vegetables. Try to fill one half of your plate at each meal with fruits and vegetables. Eat whole grains, such as whole-wheat pasta, brown rice, or whole-grain bread. Fill about one  fourth of your plate with whole grains. Eat or drink low-fat dairy products, such as skim milk or low-fat yogurt. Avoid fatty cuts of meat, processed or cured meats, and poultry with skin. Fill about one fourth of your plate with lean proteins, such as fish, chicken without skin, beans, eggs, or tofu. Avoid pre-made and processed foods. These tend to be higher in sodium, added sugar, and fat. Reduce your daily sodium intake. Many people with hypertension should eat less than 1,500 mg of sodium a day. Do not drink alcohol if: Your health care provider tells you not to drink. You are pregnant, may be pregnant, or are planning to become pregnant. If you drink alcohol: Limit how much you have to: 0-1 drink a day for women. 0-2 drinks a day for men. Know how much alcohol is in your drink. In the [place] one drink equals one 12 oz bottle of beer (355 mL), one 5 oz glass of wine (148 mL), or one 1 oz glass of hard liquor (44 mL). Lifestyle  Work with your health care provider to maintain a healthy body weight or to lose weight. Ask what an ideal weight is for you. Get at least 30 minutes of exercise that causes your heart to beat faster (aerobic exercise) most days of the week. Activities may include walking, swimming, or biking. Include exercise to strengthen your muscles (resistance exercise), such as Pilates or lifting weights, as part of your weekly exercise routine. Try to do these types of exercises for 30 minutes at least 3 days a week. Do not use any products that contain nicotine or tobacco. These products include cigarettes, chewing tobacco, and vaping devices, such as e-cigarettes. If you need help quitting, ask your health care provider. Monitor your blood pressure at home as told by your health care provider. Keep all follow-up visits. This is important. Medicines Take over-the-counter and prescription medicines only as told by your health care provider. Follow directions carefully. Blood  pressure medicines must be taken as prescribed. Do not skip doses of blood pressure medicine. Doing this puts you at risk for problems and can make the medicine less effective. Ask your health care provider about side effects or reactions to medicines that you should watch for. Contact a health care provider if you: Think you are having a reaction to a medicine you are taking. Have headaches that keep coming back (recurring). Feel dizzy. Have swelling in your ankles. Have trouble with your vision. Get help right away if you: Develop a severe headache or confusion. Have unusual weakness or numbness. Feel faint. Have severe pain in your chest or abdomen. Vomit repeatedly. Have trouble breathing. These symptoms may be an emergency. Get help right away. Call 911. Do not wait to see if the symptoms will go away. Do not drive yourself to the hospital. Summary Hypertension is when the force of blood pumping through your arteries is too strong. If this condition is not controlled, it may put you at risk for serious complications. Your personal target blood pressure may vary depending on your medical conditions, your age, and other factors. For most people, a normal blood pressure is less than 120/80. Hypertension is treated with lifestyle changes, medicines, or a combination of both. Lifestyle changes include losing weight, eating a healthy,   low-sodium diet, exercising more, and limiting alcohol. This information is not intended to replace advice given to you by your health care provider. Make sure you discuss any questions you have with your health care provider. Document Revised: 12/03/2020 Document Reviewed: 12/03/2020 Elsevier Patient Education  2023 Elsevier Inc.  

## 2022-02-26 NOTE — Patient Instructions (Signed)
Alan [name] , Thank you for taking time to come for your Medicare Wellness Visit. I appreciate your ongoing commitment to your health goals. Please review the following plan we discussed and let me know if I can assist you in the future.   These are the goals we discussed:  Goals      Patient Stated     01/19/2019, stay healthy     Patient Stated     01/25/2020, stay healthy     Patient Stated     01/29/2021, get back in to exercise        This is a list of the screening recommended for you and due dates:  Health Maintenance  Topic Date Due   DTaP/Tdap/Td vaccine (1 - Tdap) Never done   Zoster (Shingles) Vaccine (1 of 2) Never done   COVID-19 Vaccine (5 - 2023-24 season) 10/10/2021   Medicare Annual Wellness Visit  01/29/2022   Colon Cancer Screening  09/15/2028   Pneumonia Vaccine  Completed   Flu Shot  Completed   Hepatitis C Screening: USPSTF Recommendation to screen - Ages 18-79 yo.  Completed   HPV Vaccine  Aged Out    Advanced directives: Advance directive discussed with you today. Even though you declined this today please call our office should you change your mind and we can give you the proper paperwork for you to fill out.  Conditions/risks identified: none  Next appointment: Follow up in one year for your annual wellness visit.   Preventive Care 27 Years and Older, Male  Preventive care refers to lifestyle choices and visits with your health care provider that can promote health and wellness. What does preventive care include? A yearly physical exam. This is also called an annual well check. Dental exams once or twice a year. Routine eye exams. Ask your health care provider how often you should have your eyes checked. Personal lifestyle choices, including: Daily care of your teeth and gums. Regular physical activity. Eating a healthy diet. Avoiding tobacco and drug use. Limiting alcohol use. Practicing safe sex. Taking low doses of aspirin every  day. Taking vitamin and mineral supplements as recommended by your health care provider. What happens during an annual well check? The services and screenings done by your health care provider during your annual well check will depend on your age, overall health, lifestyle risk factors, and family history of disease. Counseling  Your health care provider may ask you questions about your: Alcohol use. Tobacco use. Drug use. Emotional well-being. Home and relationship well-being. Sexual activity. Eating habits. History of falls. Memory and ability to understand (cognition). Work and work Statistician. Screening  You may have the following tests or measurements: Height, weight, and BMI. Blood pressure. Lipid and cholesterol levels. These may be checked every 5 years, or more frequently if you are over 64 years old. Skin check. Lung cancer screening. You may have this screening every year starting at age 55 if you have a 30-pack-year history of smoking and currently smoke or have quit within the past 15 years. Fecal occult blood test (FOBT) of the stool. You may have this test every year starting at age 56. Flexible sigmoidoscopy or colonoscopy. You may have a sigmoidoscopy every 5 years or a colonoscopy every 10 years starting at age 60. Prostate cancer screening. Recommendations will vary depending on your family history and other risks. Hepatitis C blood test. Hepatitis B blood test. Sexually transmitted disease (STD) testing. Diabetes screening. This is done by checking your  blood sugar (glucose) after you have not eaten for a while (fasting). You may have this done every 1-3 years. Abdominal aortic aneurysm (AAA) screening. You may need this if you are a current or former smoker. Osteoporosis. You may be screened starting at age 55 if you are at high risk. Talk with your health care provider about your test results, treatment options, and if necessary, the need for more  tests. Vaccines  Your health care provider may recommend certain vaccines, such as: Influenza vaccine. This is recommended every year. Tetanus, diphtheria, and acellular pertussis (Tdap, Td) vaccine. You may need a Td booster every 10 years. Zoster vaccine. You may need this after age 71. Pneumococcal 13-valent conjugate (PCV13) vaccine. One dose is recommended after age 67. Pneumococcal polysaccharide (PPSV23) vaccine. One dose is recommended after age 49. Talk to your health care provider about which screenings and vaccines you need and how often you need them. This information is not intended to replace advice given to you by your health care provider. Make sure you discuss any questions you have with your health care provider. Document Released: 02/22/2015 Document Revised: 10/16/2015 Document Reviewed: 11/27/2014 Elsevier Interactive Patient Education  2017 [place] Prevention in the Home Falls can cause injuries. They can happen to people of all ages. There are many things you can do to make your home safe and to help prevent falls. What can I do on the outside of my home? Regularly fix the edges of walkways and driveways and fix any cracks. Remove anything that might make you trip as you walk through a door, such as a raised step or threshold. Trim any bushes or trees on the path to your home. Use bright outdoor lighting. Clear any walking paths of anything that might make someone trip, such as rocks or tools. Regularly check to see if handrails are loose or broken. Make sure that both sides of any steps have handrails. Any raised decks and porches should have guardrails on the edges. Have any leaves, snow, or ice cleared regularly. Use sand or salt on walking paths during winter. Clean up any spills in your garage right away. This includes oil or grease spills. What can I do in the bathroom? Use night lights. Install grab bars by the toilet and in the tub and shower.  Do not use towel bars as grab bars. Use non-skid mats or decals in the tub or shower. If you need to sit down in the shower, use a plastic, non-slip stool. Keep the floor dry. Clean up any water that spills on the floor as soon as it happens. Remove soap buildup in the tub or shower regularly. Attach bath mats securely with double-sided non-slip rug tape. Do not have throw rugs and other things on the floor that can make you trip. What can I do in the bedroom? Use night lights. Make sure that you have a light by your bed that is easy to reach. Do not use any sheets or blankets that are too big for your bed. They should not hang down onto the floor. Have a firm chair that has side arms. You can use this for support while you get dressed. Do not have throw rugs and other things on the floor that can make you trip. What can I do in the kitchen? Clean up any spills right away. Avoid walking on wet floors. Keep items that you use a lot in easy-to-reach places. If you need to reach something above  you, use a strong step stool that has a grab bar. Keep electrical cords out of the way. Do not use floor polish or wax that makes floors slippery. If you must use wax, use non-skid floor wax. Do not have throw rugs and other things on the floor that can make you trip. What can I do with my stairs? Do not leave any items on the stairs. Make sure that there are handrails on both sides of the stairs and use them. Fix handrails that are broken or loose. Make sure that handrails are as long as the stairways. Check any carpeting to make sure that it is firmly attached to the stairs. Fix any carpet that is loose or worn. Avoid having throw rugs at the top or bottom of the stairs. If you do have throw rugs, attach them to the floor with carpet tape. Make sure that you have a light switch at the top of the stairs and the bottom of the stairs. If you do not have them, ask someone to add them for you. What else  can I do to help prevent falls? Wear shoes that: Do not have high heels. Have rubber bottoms. Are comfortable and fit you well. Are closed at the toe. Do not wear sandals. If you use a stepladder: Make sure that it is fully opened. Do not climb a closed stepladder. Make sure that both sides of the stepladder are locked into place. Ask someone to hold it for you, if possible. Clearly mark and make sure that you can see: Any grab bars or handrails. First and last steps. Where the edge of each step is. Use tools that help you move around (mobility aids) if they are needed. These include: Canes. Walkers. Scooters. Crutches. Turn on the lights when you go into a dark area. Replace any light bulbs as soon as they burn out. Set up your furniture so you have a clear path. Avoid moving your furniture around. If any of your floors are uneven, fix them. If there are any pets around you, be aware of where they are. Review your medicines with your doctor. Some medicines can make you feel dizzy. This can increase your chance of falling. Ask your doctor what other things that you can do to help prevent falls. This information is not intended to replace advice given to you by your health care provider. Make sure you discuss any questions you have with your health care provider. Document Released: 11/22/2008 Document Revised: 07/04/2015 Document Reviewed: 03/02/2014 Elsevier Interactive Patient Education  2017 [employer].

## 2022-02-26 NOTE — Progress Notes (Signed)
I,Tianna Badgett,acting as a Neurosurgeon for [employer], FNP.,have documented all relevant documentation on the behalf of Arnette Felts, FNP,as directed by  Arnette Felts, FNP while in the presence of Arnette Felts, FNP.  Subjective:     Patient ID: Alan [name] , male    DOB: 01/03/1951 , 72 y.o.   MRN: [id number]   Chief Complaint  Patient presents with   Hypertension    HPI  Patient here for a blood pressure f/u.  Also had AWV by [facility] He was seen by Dr. Duke Salvia and his Coronary calcium score of 5.2 placing him in 34th percentile per the telephone note. He is focusing on healthy diet and  Started Aspirin EC 81mg  [name]. Does not want to start medications and will have a f/u labs in 3 months with Dr. Duke Salvia   He had covid last Tuesday to Sunday. He thinks he got it at a wedding during New Years. He reports having a mild case only had cough.    Hypertension This is a chronic problem. The current episode started more than 1 year ago. The problem is unchanged. The problem is controlled. Pertinent negatives include no anxiety, chest pain, headaches, palpitations or shortness of breath. Risk factors for coronary artery disease include male gender, sedentary lifestyle and dyslipidemia. Past treatments include calcium channel blockers. The current treatment provides significant improvement. There are no compliance problems.  There is no history of angina or kidney disease. There is no history of chronic renal disease.     Past Medical History:  Diagnosis Date   Cataracts, bilateral    Family history of hypertrophic cardiomyopathy 12/15/2021   Glaucoma    Renal disorder    kideny stone     Family History  Problem Relation Age of Onset   Heart attack Mother    Heart disease Mother    Diabetes Mother    Stroke Mother    Dementia Mother    Prostate cancer Father    Diabetes Sister    Hypertrophic cardiomyopathy Sister    Diabetes Sister    Colon cancer Brother    Heart attack Maternal  Grandfather      Current Outpatient Medications:    amLODipine (NORVASC) 5 MG tablet, Take 1 tablet (5 mg total) by mouth [name]., Disp: 90 tablet, Rfl: 1   Ascorbic Acid (VITAMIN C) 100 MG tablet, Take 100 mg by mouth [name]., Disp: , Rfl:    aspirin EC 81 MG tablet, Take 81 mg by mouth [name]. Swallow whole., Disp: , Rfl:    cholecalciferol (VITAMIN D3) 25 MCG (1000 UNIT) tablet, Take 1,000 Units by mouth [name]., Disp: , Rfl:    sildenafil (VIAGRA) 100 MG tablet, Take 1 tablet (100 mg total) by mouth [name] as needed for erectile dysfunction., Disp: 10 tablet, Rfl: 3   Timolol Maleate 0.5 % ([name]) SOLN, Place 1 drop into both eyes at bedtime., Disp: , Rfl:    Travoprost, BAK Free, (TRAVATAN) 0.004 % SOLN ophthalmic solution, Place 1 drop into both eyes at bedtime., Disp: , Rfl:    valACYclovir (VALTREX) 500 MG tablet, Take 1 tablet (500 mg total) by mouth 2 (two) times [name]., Disp: 180 tablet, Rfl: 0   zinc gluconate 50 MG tablet, Take 50 mg by mouth [name]., Disp: , Rfl:    No Known Allergies   Review of Systems  Constitutional: Negative.   Respiratory: Negative.  Negative for shortness of breath.   Cardiovascular: Negative.  Negative for chest pain, palpitations and leg swelling.  Gastrointestinal: Negative.   Neurological: Negative.  Negative for headaches.  Psychiatric/Behavioral: Negative.       There were no vitals filed for this visit. There is no height or weight on file to calculate BMI.   Objective:  Physical Exam Vitals reviewed.  Constitutional:      Appearance: Normal appearance.  Cardiovascular:     Pulses: Normal pulses.     Heart sounds: Normal heart sounds. No murmur heard. Pulmonary:     Effort: Pulmonary effort is normal. No respiratory distress.     Breath sounds: Normal breath sounds. No wheezing.  Neurological:     General: No focal deficit present.     Mental Status: He is alert and oriented to person, place, and time.     Cranial Nerves: No cranial  nerve deficit.     Motor: No weakness.  Psychiatric:        Mood and Affect: Mood normal.        Behavior: Behavior normal.        Thought Content: Thought content normal.        Judgment: Judgment normal.         Assessment And Plan:     1. Essential hypertension Comments: Blood pressure is well controlled, continue current medications  2. Pure hypercholesterolemia Comments: He is focusing on healthy diet and will recheck labs in 3 months before starting medications  3. History of COVID-19 Comments: Overall doing well     Patient was given opportunity to ask questions. Patient verbalized understanding of the plan and was able to repeat key elements of the plan. All questions were answered to their satisfaction.  Arnette Felts, FNP   I, Arnette Felts, FNP, have reviewed all documentation for this visit. The documentation on 02/26/22 for the exam, diagnosis, procedures, and orders are all accurate and complete.   IF YOU HAVE BEEN REFERRED TO A SPECIALIST, IT MAY TAKE 1-2 WEEKS TO SCHEDULE/PROCESS THE REFERRAL. IF YOU HAVE NOT HEARD FROM US/SPECIALIST IN TWO WEEKS, PLEASE GIVE [place] A CALL AT [phone].   THE PATIENT IS ENCOURAGED TO PRACTICE SOCIAL DISTANCING DUE TO THE COVID-19 PANDEMIC.

## 2022-02-26 NOTE — Progress Notes (Signed)
Subjective:   Alan [name] is a 72 y.o. male who presents for Medicare Annual/Subsequent preventive examination.  Review of Systems     Cardiac Risk Factors include: advanced age (>74men, >59 women);hypertension;male gender     Objective:    Today's Vitals   02/26/22 1438  BP: 122/60  Pulse: 75  Temp: (!) 97.5 F (36.4 C)  TempSrc: Oral  SpO2: 99%  Weight: 182 lb 12.8 oz (82.9 kg)  Height: 5' 8.6" (1.742 m)   Body mass index is 27.31 kg/m.     02/26/2022    2:47 PM 01/29/2021    3:03 PM 01/25/2020    2:39 PM 01/19/2019    3:25 PM 12/22/2017    4:01 PM 08/01/2017    8:45 PM  Advanced Directives  Does Patient Have a Medical Advance Directive? No No No No No No  Would patient like information on creating a medical advance directive? No - Patient declined No - Patient declined Yes (MAU/Ambulatory/Procedural Areas - Information given) Yes (MAU/Ambulatory/Procedural Areas - Information given) Yes (MAU/Ambulatory/Procedural Areas - Information given) No - Patient declined    Current Medications (verified) Outpatient Encounter Medications as of 02/26/2022  Medication Sig   amLODipine (NORVASC) 5 MG tablet Take 1 tablet (5 mg total) by mouth daily.   Ascorbic Acid (VITAMIN C) 100 MG tablet Take 100 mg by mouth daily.   aspirin EC 81 MG tablet Take 81 mg by mouth daily. Swallow whole.   cholecalciferol (VITAMIN D3) 25 MCG (1000 UNIT) tablet Take 1,000 Units by mouth daily.   sildenafil (VIAGRA) 100 MG tablet Take 1 tablet (100 mg total) by mouth daily as needed for erectile dysfunction.   Timolol Maleate 0.5 % (DAILY) SOLN Place 1 drop into both eyes at bedtime.   Travoprost, BAK Free, (TRAVATAN) 0.004 % SOLN ophthalmic solution Place 1 drop into both eyes at bedtime.   valACYclovir (VALTREX) 500 MG tablet Take 1 tablet (500 mg total) by mouth 2 (two) times daily.   zinc gluconate 50 MG tablet Take 50 mg by mouth daily.   No facility-administered encounter medications on  file as of 02/26/2022.    Allergies (verified) Patient has no known allergies.   History: Past Medical History:  Diagnosis Date   Cataracts, bilateral    Family history of hypertrophic cardiomyopathy 12/15/2021   Glaucoma    Renal disorder    kideny stone   History reviewed. No pertinent surgical history. Family History  Problem Relation Age of Onset   Heart attack Mother    Heart disease Mother    Diabetes Mother    Stroke Mother    Dementia Mother    Prostate cancer Father    Diabetes Sister    Hypertrophic cardiomyopathy Sister    Diabetes Sister    Colon cancer Brother    Heart attack Maternal Grandfather    Social History   Socioeconomic History   Marital status: Married    Spouse name: Not on file   Number of children: Not on file   Years of education: Not on file   Highest education level: Not on file  Occupational History   Not on file  Tobacco Use   Smoking status: Former    Packs/day: 0.25    Types: Cigarettes    Quit date: 02/09/1981    Years since quitting: 41.0   Smokeless tobacco: Never  Vaping Use   Vaping Use: Never used  Substance and Sexual Activity   Alcohol use: Yes  Comment: socially   Drug use: Never   Sexual activity: Yes  Other Topics Concern   Not on file  Social History Narrative   Not on file   Social Determinants of Health   Financial Resource Strain: Low Risk  (02/26/2022)   Overall Financial Resource Strain (CARDIA)    Difficulty of Paying Living Expenses: Not hard at all  Food Insecurity: No Food Insecurity (02/26/2022)   Hunger Vital Sign    Worried About Running Out of Food in the Last Year: Never true    Ran Out of Food in the Last Year: Never true  Transportation Needs: No Transportation Needs (02/26/2022)   PRAPARE - Administrator, Civil Service (Medical): No    Lack of Transportation (Non-Medical): No  Physical Activity: Inactive (02/26/2022)   Exercise Vital Sign    Days of Exercise per Week: 0 days     Minutes of Exercise per Session: 0 min  Stress: No Stress Concern Present (02/26/2022)   [name] of Occupational Health - Occupational Stress Questionnaire    Feeling of Stress : Not at all  Social Connections: Not on file    Tobacco Counseling Counseling given: Not Answered   Clinical Intake:  Pre-visit preparation completed: Yes  Pain : No/denies pain     Nutritional Status: BMI 25 -29 Overweight Nutritional Risks: None Diabetes: No  How often do you need to have someone help you when you read instructions, pamphlets, or other written materials from your doctor or pharmacy?: 1 - Never  Diabetic? no  Interpreter Needed?: No  Information entered by :: NAllen LPN   Activities of Daily Living    02/26/2022    2:48 PM  In your present state of health, do you have any difficulty performing the following activities:  Hearing? 0  Vision? 0  Difficulty concentrating or making decisions? 0  Walking or climbing stairs? 0  Dressing or bathing? 0  Doing errands, shopping? 0  Preparing Food and eating ? N  Using the Toilet? N  In the past six months, have you accidently leaked urine? N  Do you have problems with loss of bowel control? N  Managing your Medications? N  Managing your Finances? N  Housekeeping or managing your Housekeeping? N    Patient Care Team: Arnette Felts, FNP as PCP - General (General Practice) Chilton Si, MD as PCP - Cardiology (Cardiology) Arnette Felts, FNP (General Practice)  Indicate any recent Medical Services you may have received from other than [facility] providers in the past year (date may be approximate).     Assessment:   This is a routine wellness examination for [place].  Hearing/Vision screen Vision Screening - Comments:: [facility]  Dietary issues and exercise activities discussed: Current Exercise Habits: The patient does not participate in regular exercise at present   Goals Addressed              This Visit's Progress    Patient Stated       02/26/2022, wants to get cholesterol down       Depression Screen    02/26/2022    2:48 PM 10/29/2021    2:58 PM 01/29/2021    3:04 PM 01/29/2021    2:40 PM 01/25/2020    2:40 PM 01/25/2020    2:18 PM 01/19/2019    3:26 PM  PHQ 2/9 Scores  PHQ - 2 Score 0 0 0 0 0 0 0  PHQ- 9 Score    0  0    Fall Risk    02/26/2022    2:48 PM 10/29/2021    2:57 PM 01/29/2021    3:04 PM 01/29/2021    2:41 PM 01/25/2020    2:40 PM  Fall Risk   Falls in the past year? 0 0 0 0 0  Number falls in past yr: 0 0  0   Injury with Fall? 0 0  0   Risk for fall due to : Medication side effect No Fall Risks Medication side effect  Medication side effect  Follow up Falls prevention discussed;Education provided;Falls evaluation completed Falls evaluation completed Falls evaluation completed;Education provided;Falls prevention discussed  Falls evaluation completed;Education provided;Falls prevention discussed    FALL RISK PREVENTION PERTAINING TO THE HOME:  Any stairs in or around the home? No  If so, are there any without handrails? N/a Home free of loose throw rugs in walkways, pet beds, electrical cords, etc? Yes  Adequate lighting in your home to reduce risk of falls? Yes   ASSISTIVE DEVICES UTILIZED TO PREVENT FALLS:  Life alert? No  Use of a cane, walker or w/c? No  Grab bars in the bathroom? No  Shower chair or bench in shower? No  Elevated toilet seat or a handicapped toilet? No   TIMED UP AND GO:  Was the test performed? Yes .  Length of time to ambulate 10 feet: 5 sec.   Gait steady and fast without use of assistive device  Cognitive Function:        02/26/2022    2:49 PM 01/29/2021    3:05 PM 01/25/2020    2:40 PM 01/19/2019    3:27 PM 12/22/2017    4:04 PM  6CIT Screen  What Year? 0 points 0 points 0 points 0 points 0 points  What month? 0 points 0 points 0 points 0 points 0 points  What time? 0 points 0 points 0 points 0  points 0 points  Count back from 20 0 points 0 points 0 points 0 points 0 points  Months in reverse 0 points 0 points 0 points 0 points 0 points  Repeat phrase 0 points 2 points 0 points 0 points 0 points  Total Score 0 points 2 points 0 points 0 points 0 points    Immunizations Immunization History  Administered Date(s) Administered   Fluad Quad(high Dose 65+) 10/29/2021   Influenza, High Dose Seasonal PF 11/24/2018, 09/23/2020   Influenza-Unspecified 11/22/2017, 10/25/2019   Moderna Sars-Covid-2 Vaccination 02/24/2019, 03/24/2019, 02/01/2020, 09/23/2020   PNEUMOCOCCAL CONJUGATE-20 10/29/2021   Pneumococcal Polysaccharide-23 05/22/2020    TDAP status: Due, Education has been provided regarding the importance of this vaccine. Advised may receive this vaccine at local pharmacy or Health Dept. Aware to provide a copy of the vaccination record if obtained from local pharmacy or Health Dept. Verbalized acceptance and understanding.  Flu Vaccine status: Up to date  Pneumococcal vaccine status: Up to date  Covid-19 vaccine status: Completed vaccines  Qualifies for Shingles Vaccine? Yes   Zostavax completed No   Shingrix Completed?: No.    Education has been provided regarding the importance of this vaccine. Patient has been advised to call insurance company to determine out of pocket expense if they have not yet received this vaccine. Advised may also receive vaccine at local pharmacy or Health Dept. Verbalized acceptance and understanding.  Screening Tests Health Maintenance  Topic Date Due   DTaP/Tdap/Td (1 - Tdap) Never done   COVID-19 Vaccine (5 - 2023-24 season) 10/10/2021  Medicare Annual Wellness (AWV)  01/29/2022   Zoster Vaccines- Shingrix (1 of 2) 05/28/2022 (Originally 06/20/2000)   COLONOSCOPY (Pts 45-48yrs Insurance coverage will need to be confirmed)  09/15/2028   Pneumonia Vaccine 1+ Years old  Completed   INFLUENZA VACCINE  Completed   Hepatitis C Screening   Completed   HPV VACCINES  Aged Out    Health Maintenance  Health Maintenance Due  Topic Date Due   DTaP/Tdap/Td (1 - Tdap) Never done   COVID-19 Vaccine (5 - 2023-24 season) 10/10/2021   Medicare Annual Wellness (AWV)  01/29/2022    Colorectal cancer screening: Type of screening: Colonoscopy. Completed 09/16/2018. Repeat every 10 years  Lung Cancer Screening: (Low Dose CT Chest recommended if Age 37-80 years, 30 pack-year currently smoking OR have quit w/in 15years.) does not qualify.   Lung Cancer Screening Referral: no  Additional Screening:  Hepatitis C Screening: does qualify; Completed 12/22/2017  Vision Screening: Recommended annual ophthalmology exams for early detection of glaucoma and other disorders of the eye. Is the patient up to date with their annual eye exam?  Yes  Who is the provider or what is the name of the office in which the patient attends annual eye exams? [facility] If pt is not established with a provider, would they like to be referred to a provider to establish care? No .   Dental Screening: Recommended annual dental exams for proper oral hygiene  Community Resource Referral / Chronic Care Management: CRR required this visit?  No   CCM required this visit?  No      Plan:     I have personally reviewed and noted the following in the patient's chart:   Medical and social history Use of alcohol, tobacco or illicit drugs  Current medications and supplements including opioid prescriptions. Patient is not currently taking opioid prescriptions. Functional ability and status Nutritional status Physical activity Advanced directives List of other physicians Hospitalizations, surgeries, and ER visits in previous 12 months Vitals Screenings to include cognitive, depression, and falls Referrals and appointments  In addition, I have reviewed and discussed with patient certain preventive protocols, quality metrics, and best practice recommendations. A written  personalized care plan for preventive services as well as general preventive health recommendations were provided to patient.     Kellie Simmering, LPN   7/58/8325   Nurse Notes: none

## 2022-03-02 ENCOUNTER — Encounter: Payer: Self-pay | Admitting: Nurse Practitioner

## 2022-03-24 ENCOUNTER — Encounter (HOSPITAL_BASED_OUTPATIENT_CLINIC_OR_DEPARTMENT_OTHER): Payer: Self-pay | Admitting: Cardiovascular Disease

## 2022-03-24 ENCOUNTER — Ambulatory Visit (INDEPENDENT_AMBULATORY_CARE_PROVIDER_SITE_OTHER): Payer: Medicare Other | Admitting: Cardiovascular Disease

## 2022-03-24 VITALS — BP 128/70 | HR 60 | Ht 70.5 in | Wt 184.5 lb

## 2022-03-24 DIAGNOSIS — Z8249 Family history of ischemic heart disease and other diseases of the circulatory system: Secondary | ICD-10-CM | POA: Diagnosis not present

## 2022-03-24 DIAGNOSIS — I2510 Atherosclerotic heart disease of native coronary artery without angina pectoris: Secondary | ICD-10-CM

## 2022-03-24 DIAGNOSIS — I10 Essential (primary) hypertension: Secondary | ICD-10-CM | POA: Diagnosis not present

## 2022-03-24 HISTORY — DX: Atherosclerotic heart disease of native coronary artery without angina pectoris: I25.10

## 2022-03-24 NOTE — Assessment & Plan Note (Signed)
Echo revealed mild to moderate LVH.  I suspect this is due to blood pressure and age.  However, we will repeat his echo in 1 year to ensure that there is no evidence of progression or HCM.  Clinically he is stable.

## 2022-03-24 NOTE — Progress Notes (Signed)
Cardiology Office Note:    Date:  03/24/2022   ID:  Alan [name], DOB 03/05/1950, MRN [id number]  PCP:  Arnette Felts, FNP    HeartCare Providers Cardiologist:  Chilton Si, MD     Referring MD: Arnette Felts, FNP   No chief complaint on file.   History of Present Illness:    Alan [name] is a 72 y.o. male with a hx of hypertension and prior tobacco abuse here for follow up. He was last seen 12/2021 for family history of hypertrophic cardiomyopathy. He saw his PCP 10/2021 and had been off his blood pressure medication in favor of supplements. Blood pressures were in the 150s to 160s so he resumed his medicines and BP was then controlled on amlodipine. He noted that his sister was recently diagnosed with HCM, so he was referred to cardiology.    He had an echo that revealed LVEF 60-65% with mild LVH and normal diastolic function. He had a coronary calcium score of 5 which was 34th percentile.  Today, the patient states that he has been feeling good recently and has been staying active. He sometimes gets lightheaded for a few seconds with exercise but denies syncope. He has occasional slight BLE swelling irregularly.   He denies any palpitations, chest pain, shortness of breath. No headaches, syncope, orthopnea, or PND.   Past Medical History:  Diagnosis Date   CAD in native artery 03/24/2022   Cataracts, bilateral    Family history of hypertrophic cardiomyopathy 12/15/2021   Glaucoma    Renal disorder    kideny stone    History reviewed. No pertinent surgical history.  Current Medications: Current Meds  Medication Sig   amLODipine (NORVASC) 5 MG tablet Take 1 tablet (5 mg total) by mouth [name].   Ascorbic Acid (VITAMIN C) 100 MG tablet Take 100 mg by mouth [name].   aspirin EC 81 MG tablet Take 81 mg by mouth [name]. Swallow whole.   cholecalciferol (VITAMIN D3) 25 MCG (1000 UNIT) tablet Take 1,000 Units by mouth [name].   sildenafil (VIAGRA) 100 MG tablet  Take 1 tablet (100 mg total) by mouth [name] as needed for erectile dysfunction.   Timolol Maleate 0.5 % ([name]) SOLN Place 1 drop into both eyes at bedtime.   Travoprost, BAK Free, (TRAVATAN) 0.004 % SOLN ophthalmic solution Place 1 drop into both eyes at bedtime.   valACYclovir (VALTREX) 500 MG tablet Take 1 tablet (500 mg total) by mouth 2 (two) times [name].   zinc gluconate 50 MG tablet Take 50 mg by mouth [name].     Allergies:   Patient has no known allergies.   Social History   Socioeconomic History   Marital status: Married    Spouse name: Not on file   Number of children: Not on file   Years of education: Not on file   Highest education level: Not on file  Occupational History   Not on file  Tobacco Use   Smoking status: Former    Packs/day: 0.25    Types: Cigarettes    Quit date: 02/09/1981    Years since quitting: 41.1   Smokeless tobacco: Never  Vaping Use   Vaping Use: Never used  Substance and Sexual Activity   Alcohol use: Yes    Comment: socially   Drug use: Never   Sexual activity: Yes  Other Topics Concern   Not on file  Social History Narrative   Not on file   Social Determinants of Health   Financial  Resource Strain: Low Risk  (02/26/2022)   Overall Financial Resource Strain (CARDIA)    Difficulty of Paying Living Expenses: Not hard at all  Food Insecurity: No Food Insecurity (02/26/2022)   Hunger Vital Sign    Worried About Running Out of Food in the Last Year: Never true    Ran Out of Food in the Last Year: Never true  Transportation Needs: No Transportation Needs (02/26/2022)   PRAPARE - Administrator, Civil Service (Medical): No    Lack of Transportation (Non-Medical): No  Physical Activity: Inactive (02/26/2022)   Exercise Vital Sign    Days of Exercise per Week: 0 days    Minutes of Exercise per Session: 0 min  Stress: No Stress Concern Present (02/26/2022)   [name] of Occupational Health - Occupational Stress  Questionnaire    Feeling of Stress : Not at all  Social Connections: Not on file     Family History: The patient's family history includes Colon cancer in his brother; Dementia in his mother; Diabetes in his mother, sister, and sister; Heart attack in his maternal grandfather and mother; Heart disease in his mother; Hypertrophic cardiomyopathy in his sister; Prostate cancer in his father; Stroke in his mother.  ROS:   Please see the history of present illness.    (+)Lightheadedness (+) Slight BLE edema occasionally All other systems reviewed and are negative.  EKGs/Labs/Other Studies Reviewed:    The following studies were reviewed today: No prior cardiovascular studies.   EKG: The EKG is personally reviewed. 03/24/22: The EKG was not ordered 12/15/21: Sinus bradycardia. Rate 54 bpm.    Recent Labs: 10/29/2021: BUN 12; Creatinine, Ser 1.14; Potassium 4.5; Sodium 140  Recent Lipid Panel    Component Value Date/Time   CHOL 196 10/29/2021 1602   TRIG 72 10/29/2021 1602   HDL 47 10/29/2021 1602   CHOLHDL 4.2 10/29/2021 1602   LDLCALC 136 (H) 10/29/2021 1602    Physical Exam:    VS:  BP 128/70   Pulse 60   Ht 5' 10.5" (1.791 m)   Wt 184 lb 8.3 oz (83.7 kg)   BMI 26.10 kg/m     Wt Readings from Last 3 Encounters:  03/24/22 184 lb 8.3 oz (83.7 kg)  02/26/22 182 lb (82.6 kg)  02/26/22 182 lb 12.8 oz (82.9 kg)    VS:  BP 128/70   Pulse 60   Ht 5' 10.5" (1.791 m)   Wt 184 lb 8.3 oz (83.7 kg)   BMI 26.10 kg/m  , BMI Body mass index is 26.1 kg/m. GENERAL:  Well appearing HEENT: Pupils equal round and reactive, fundi not visualized, oral mucosa unremarkable NECK:  No jugular venous distention, waveform within normal limits, carotid upstroke brisk and symmetric, no bruits, no thyromegaly LUNGS:  Clear to auscultation bilaterally HEART:  RRR.  PMI not displaced or sustained,S1 and S2 within normal limits, no S3, no S4, no clicks, no rubs, no murmurs ABD:  Flat, positive  bowel sounds normal in frequency in pitch, no bruits, no rebound, no guarding, no midline pulsatile mass, no hepatomegaly, no splenomegaly EXT:  2 plus pulses throughout, no edema, no cyanosis no clubbing SKIN:  No rashes no nodules NEURO:  Cranial nerves II through XII grossly intact, motor grossly intact throughout PSYCH:  Cognitively intact, oriented to person place and time    ASSESSMENT:    1. Essential hypertension   2. Family history of hypertrophic cardiomyopathy   3. CAD in native artery  PLAN:    Essential hypertension Blood pressure has been well-controlled.  Mild lightheadedness with positional changes.  Continue amlodipine.  Recommend increasing exercise to at least 150 minutes weekly.  Family history of hypertrophic cardiomyopathy Echo revealed mild to moderate LVH.  I suspect this is due to blood pressure and age.  However, we will repeat his echo in 1 year to ensure that there is no evidence of progression or HCM.  Clinically he is stable.  CAD in native artery Coronary calcium score was 5.  This was 34th percentile for age and gender.  Recommend working on diet and exercise rather than starting any cholesterol medication at this time.  Plan: Repeat echo in a year and follow up based on results  Disposition: FU with Tonja Jezewski C. Duke Salvia, MD, [facility] prn.  Medication Adjustments/Labs and Tests Ordered: Current medicines are reviewed at length with the patient today.  Concerns regarding medicines are outlined above.  No orders of the defined types were placed in this encounter.  No orders of the defined types were placed in this encounter.   There are no Patient Instructions on file for this visit.   I,Coren O'Brien,acting as a Neurosurgeon for [employer], MD.,have documented all relevant documentation on the behalf of Chilton Si, MD,as directed by  Chilton Si, MD while in the presence of Chilton Si, MD.  I, Kailani Brass C. Duke Salvia, MD have  reviewed all documentation for this visit.  The documentation of the exam, diagnosis, procedures, and orders on 03/24/2022 are all accurate and complete.

## 2022-03-24 NOTE — Addendum Note (Signed)
Addended by: Alvina Filbert B on: 03/24/2022 08:50 AM   Modules accepted: Orders

## 2022-03-24 NOTE — Patient Instructions (Addendum)
Medication Instructions:  Your physician recommends that you continue on your current medications as directed. Please refer to the Current Medication list given to you today.   *If you need a refill on your cardiac medications before your next appointment, please call your pharmacy*  Lab Work: NONE  Testing/Procedures: Your physician has requested that you have an echocardiogram. Echocardiography is a painless test that uses sound waves to create images of your heart. It provides your doctor with information about the size and shape of your heart and how well your heart's chambers and valves are working. This procedure takes approximately one hour. There are no restrictions for this procedure. Please do NOT wear cologne, perfume, aftershave, or lotions (deodorant is allowed). Please arrive 15 minutes prior to your appointment time. IN 1 YEAR ABOUT 1 WEEK PRIOR TO FOLLOW UP   Follow-Up: At [facility], you and your health needs are our priority.  As part of our continuing mission to provide you with exceptional heart care, we have created designated Provider Care Teams.  These Care Teams include your primary Cardiologist (physician) and Advanced Practice Providers (APPs -  Physician Assistants and Nurse Practitioners) who all work together to provide you with the care you need, when you need it.  We recommend signing up for the patient portal called "MyChart".  Sign up information is provided on this After Visit Summary.  MyChart is used to connect with patients for Virtual Visits (Telemedicine).  Patients are able to view lab/test results, encounter notes, upcoming appointments, etc.  Non-urgent messages can be sent to your provider as well.   To learn more about what you can do with MyChart, go to [email or web address].    Your next appointment:   AFTER ECHO IN 12 month(s)  Provider:   Skeet Latch, MD    Exercise recommendations: The American Heart Association recommends  150 minutes of moderate intensity exercise weekly. Try 30 minutes of moderate intensity exercise 4-5 times per week. This could include walking, jogging, or swimming.

## 2022-03-24 NOTE — Assessment & Plan Note (Signed)
Coronary calcium score was 5.  This was 34th percentile for age and gender.  Recommend working on diet and exercise rather than starting any cholesterol medication at this time.

## 2022-03-24 NOTE — Assessment & Plan Note (Signed)
Blood pressure has been well-controlled.  Mild lightheadedness with positional changes.  Continue amlodipine.  Recommend increasing exercise to at least 150 minutes weekly.

## 2022-04-04 ENCOUNTER — Other Ambulatory Visit: Payer: Self-pay | Admitting: Nurse Practitioner

## 2022-08-27 ENCOUNTER — Encounter: Payer: Self-pay | Admitting: Nurse Practitioner

## 2022-08-27 ENCOUNTER — Ambulatory Visit (INDEPENDENT_AMBULATORY_CARE_PROVIDER_SITE_OTHER): Payer: Medicare Other | Admitting: Nurse Practitioner

## 2022-08-27 VITALS — BP 120/80 | HR 66 | Temp 98.4°F | Ht 70.5 in | Wt 182.0 lb

## 2022-08-27 DIAGNOSIS — Z2821 Immunization not carried out because of patient refusal: Secondary | ICD-10-CM

## 2022-08-27 DIAGNOSIS — E7800 Pure hypercholesterolemia, unspecified: Secondary | ICD-10-CM | POA: Diagnosis not present

## 2022-08-27 DIAGNOSIS — I10 Essential (primary) hypertension: Secondary | ICD-10-CM

## 2022-08-27 DIAGNOSIS — I119 Hypertensive heart disease without heart failure: Secondary | ICD-10-CM

## 2022-08-27 DIAGNOSIS — I2510 Atherosclerotic heart disease of native coronary artery without angina pectoris: Secondary | ICD-10-CM | POA: Diagnosis not present

## 2022-08-27 DIAGNOSIS — H9313 Tinnitus, bilateral: Secondary | ICD-10-CM

## 2022-08-27 NOTE — Progress Notes (Signed)
Alan [name], CMA,acting as a Neurosurgeon for Alan Felts, FNP.,have documented all relevant documentation on the behalf of Alan Felts, FNP,as directed by  Alan Felts, FNP while in the presence of Alan Felts, FNP.  Subjective:  Patient ID: Alan [name] , male    DOB: May 25, 1950 , 72 y.o.   MRN: [id number]  Chief Complaint  Patient presents with   Hypertension    HPI  Patient presents today for a BP and Chol follow up. Patient reports compliance with medications. Patient denies any chest pain, SOB, or headaches. Patient has no other concerns today. He has not taken his medications today takes them at night. He had one cup of coffee and water today. Prazosin was started for his nightmares for about one month but stopped due to having lightheaded and dizziness. He has not started it back and the [place] mental [facility] provider said not to start. There is not anything to give at this time. He has started back on amlodipine around July 4th was off amlodipine for about one month. Continues to have tinnitus has seen ENT years ago now is getting worse.   Hypertension This is a chronic problem. The current episode started more than 1 year ago. The problem is unchanged. The problem is controlled. Pertinent negatives include no anxiety, chest pain, headaches, palpitations or shortness of breath. Risk factors for coronary artery disease include male gender, sedentary lifestyle and dyslipidemia. Past treatments include calcium channel blockers. The current treatment provides significant improvement. There are no compliance problems.  There is no history of angina or kidney disease. There is no history of chronic renal disease.     Past Medical History:  Diagnosis Date   CAD in native artery 03/24/2022   Cataracts, bilateral    Family history of hypertrophic cardiomyopathy 12/15/2021   Glaucoma    Renal disorder    kideny stone     Family History  Problem Relation Age of Onset   Heart attack Mother     Heart disease Mother    Diabetes Mother    Stroke Mother    Dementia Mother    Prostate cancer Father    Diabetes Sister    Hypertrophic cardiomyopathy Sister    Diabetes Sister    Colon cancer Brother    Heart attack Maternal Grandfather      Current Outpatient Medications:    amLODipine (NORVASC) 5 MG tablet, TAKE 1 TABLET [name], Disp: 90 tablet, Rfl: 3   Ascorbic Acid (VITAMIN C) 100 MG tablet, Take 100 mg by mouth [name]., Disp: , Rfl:    aspirin EC 81 MG tablet, Take 81 mg by mouth [name]. Swallow whole., Disp: , Rfl:    cholecalciferol (VITAMIN D3) 25 MCG (1000 UNIT) tablet, Take 1,000 Units by mouth [name]., Disp: , Rfl:    sildenafil (VIAGRA) 100 MG tablet, Take 1 tablet (100 mg total) by mouth [name] as needed for erectile dysfunction., Disp: 10 tablet, Rfl: 3   Timolol Maleate 0.5 % ([name]) SOLN, Place 1 drop into both eyes at bedtime., Disp: , Rfl:    Travoprost, BAK Free, (TRAVATAN) 0.004 % SOLN ophthalmic solution, Place 1 drop into both eyes at bedtime., Disp: , Rfl:    valACYclovir (VALTREX) 500 MG tablet, Take 1 tablet (500 mg total) by mouth 2 (two) times [name]., Disp: 180 tablet, Rfl: 0   zinc gluconate 50 MG tablet, Take 50 mg by mouth [name]., Disp: , Rfl:    No Known Allergies   Review of Systems  Constitutional: Negative.   Respiratory:  Negative for shortness of breath.   Cardiovascular:  Negative for chest pain and palpitations.  Neurological:  Negative for headaches.  Psychiatric/Behavioral: Negative.       Today's Vitals   08/27/22 1430 08/27/22 1523  BP: (!) 140/90 120/80  Pulse: 66   Temp: 98.4 F (36.9 C)   Weight: 182 lb (82.6 kg)   Height: 5' 10.5" (1.791 m)   PainSc: 4    PainLoc: Hip    Body mass index is 25.75 kg/m.  Wt Readings from Last 3 Encounters:  08/27/22 182 lb (82.6 kg)  03/24/22 184 lb 8.3 oz (83.7 kg)  02/26/22 182 lb (82.6 kg)    The 10-year ASCVD risk score (Arnett DK, et al., 2019) is: 19.5%   Values used to calculate  the score:     Age: 10 years     Sex: Male     Is Non-Hispanic African American: Yes     Diabetic: No     Tobacco smoker: No     Systolic Blood Pressure: 120 mmHg     Is BP treated: Yes     HDL Cholesterol: 40 mg/dL     Total Cholesterol: 209 mg/dL  Objective:  Physical Exam Vitals reviewed.  Constitutional:      General: He is not in acute distress.    Appearance: Normal appearance.  Cardiovascular:     Rate and Rhythm: Normal rate and regular rhythm.     Pulses: Normal pulses.     Heart sounds: Normal heart sounds. No murmur heard. Pulmonary:     Effort: Pulmonary effort is normal. No respiratory distress.     Breath sounds: Normal breath sounds. No wheezing.  Skin:    General: Skin is warm and dry.     Capillary Refill: Capillary refill takes less than 2 seconds.  Neurological:     General: No focal deficit present.     Mental Status: He is alert and oriented to person, place, and time.     Cranial Nerves: No cranial nerve deficit.     Motor: No weakness.  Psychiatric:        Mood and Affect: Mood normal.        Behavior: Behavior normal.        Thought Content: Thought content normal.        Judgment: Judgment normal.         Assessment And Plan:  Essential hypertension Assessment & Plan: Blood pressure is controlled, diastolic at border encouraged to focus on lifestyle modifications  Orders: -     Basic metabolic panel  Pure hypercholesterolemia Assessment & Plan: Chronic, his calcium risk score was 5 however he has been working on diet and exercise. Continues to be followed by Alan [name]  Orders: -     Lipid panel  Tinnitus of both ears Assessment & Plan: This has been persistent, will refer to tinnitis clinic and check carotid doppler   Orders: -     US Carotid Bilateral; Future -     Ambulatory referral to Physical Therapy  Herpes zoster vaccination declined Assessment & Plan: Declines shingrix, educated on disease process and is aware if he  changes his mind to notify office    CAD in native artery    Return in about 4 months (around 12/28/2022) for bp check.  Patient was given opportunity to ask questions. Patient verbalized understanding of the plan and was able to repeat key elements of the plan. All  questions were answered to their satisfaction.    Jeanell Sparrow, FNP, have reviewed all documentation for this visit. The documentation on 08/27/22 for the exam, diagnosis, procedures, and orders are all accurate and complete.   IF YOU HAVE BEEN REFERRED TO A SPECIALIST, IT MAY TAKE 1-2 WEEKS TO SCHEDULE/PROCESS THE REFERRAL. IF YOU HAVE NOT HEARD FROM US/SPECIALIST IN TWO WEEKS, PLEASE GIVE [place] A CALL AT [phone].

## 2022-08-28 LAB — BASIC METABOLIC PANEL
BUN/Creatinine Ratio: 15 (ref 10–24)
BUN: 17 mg/dL (ref 8–27)
CO2: 25 mmol/L (ref 20–29)
Calcium: 9.4 mg/dL (ref 8.6–10.2)
Chloride: 102 mmol/L (ref 96–106)
Creatinine, Ser: 1.17 mg/dL (ref 0.76–1.27)
Glucose: 87 mg/dL (ref 70–99)
Potassium: 4.7 mmol/L (ref 3.5–5.2)
Sodium: 140 mmol/L (ref 134–144)
eGFR: 66 mL/min/{1.73_m2} (ref >59)

## 2022-08-28 LAB — LIPID PANEL
Chol/HDL Ratio: 5.2 ratio — ABNORMAL HIGH (ref 0.0–5.0)
Cholesterol, Total: 209 mg/dL — ABNORMAL HIGH (ref 100–199)
HDL: 40 mg/dL (ref >39)
LDL Chol Calc (NIH): 153 mg/dL — ABNORMAL HIGH (ref 0–99)
Triglycerides: 86 mg/dL (ref 0–149)
VLDL Cholesterol Cal: 16 mg/dL (ref 5–40)

## 2022-09-08 ENCOUNTER — Ambulatory Visit
Admission: RE | Admit: 2022-09-08 | Discharge: 2022-09-08 | Disposition: A | Discharge status: Home / Self Care | Payer: Medicare Other | Source: Ambulatory Visit | Attending: Nurse Practitioner | Admitting: Nurse Practitioner

## 2022-09-08 DIAGNOSIS — H9313 Tinnitus, bilateral: Secondary | ICD-10-CM

## 2022-09-08 DIAGNOSIS — Z2821 Immunization not carried out because of patient refusal: Secondary | ICD-10-CM | POA: Insufficient documentation

## 2022-09-08 DIAGNOSIS — Z8679 Personal history of other diseases of the circulatory system: Secondary | ICD-10-CM | POA: Diagnosis not present

## 2022-09-08 NOTE — Assessment & Plan Note (Signed)
This has been persistent, will refer to tinnitis clinic and check carotid doppler

## 2022-09-08 NOTE — Assessment & Plan Note (Signed)
Blood pressure is controlled, diastolic at border encouraged to focus on lifestyle modifications

## 2022-09-08 NOTE — Assessment & Plan Note (Addendum)
Chronic, his calcium risk score was 5 however he has been working on diet and exercise. Continues to be followed by Dr. Duke Salvia

## 2022-09-08 NOTE — Assessment & Plan Note (Signed)
Declines shingrix, educated on disease process and is aware if he changes his mind to notify office  

## 2022-09-25 NOTE — Progress Notes (Signed)
I did not order a PSA he can come in for that to be done. His insurance does not cover a screening.

## 2022-11-13 ENCOUNTER — Emergency Department (HOSPITAL_BASED_OUTPATIENT_CLINIC_OR_DEPARTMENT_OTHER)
Admission: EM | Admit: 2022-11-13 | Discharge: 2022-11-14 | Disposition: A | Discharge status: Home / Self Care | Payer: Medicare Other | Attending: Emergency Medicine | Admitting: Emergency Medicine

## 2022-11-13 ENCOUNTER — Emergency Department (HOSPITAL_BASED_OUTPATIENT_CLINIC_OR_DEPARTMENT_OTHER): Payer: Medicare Other

## 2022-11-13 ENCOUNTER — Encounter (HOSPITAL_BASED_OUTPATIENT_CLINIC_OR_DEPARTMENT_OTHER): Payer: Self-pay | Admitting: Emergency Medicine

## 2022-11-13 ENCOUNTER — Other Ambulatory Visit: Payer: Self-pay

## 2022-11-13 DIAGNOSIS — I10 Essential (primary) hypertension: Secondary | ICD-10-CM | POA: Diagnosis not present

## 2022-11-13 DIAGNOSIS — R109 Unspecified abdominal pain: Secondary | ICD-10-CM | POA: Diagnosis not present

## 2022-11-13 DIAGNOSIS — Z7982 Long term (current) use of aspirin: Secondary | ICD-10-CM | POA: Insufficient documentation

## 2022-11-13 DIAGNOSIS — N200 Calculus of kidney: Secondary | ICD-10-CM

## 2022-11-13 DIAGNOSIS — N281 Cyst of kidney, acquired: Secondary | ICD-10-CM | POA: Diagnosis not present

## 2022-11-13 DIAGNOSIS — N132 Hydronephrosis with renal and ureteral calculous obstruction: Secondary | ICD-10-CM | POA: Diagnosis not present

## 2022-11-13 DIAGNOSIS — Z79899 Other long term (current) drug therapy: Secondary | ICD-10-CM | POA: Insufficient documentation

## 2022-11-13 DIAGNOSIS — N3090 Cystitis, unspecified without hematuria: Secondary | ICD-10-CM | POA: Diagnosis not present

## 2022-11-13 LAB — CBC WITH DIFFERENTIAL/PLATELET
Abs Immature Granulocytes: 0.01 10*3/uL (ref 0.00–0.07)
Basophils Absolute: 0 10*3/uL (ref 0.0–0.1)
Basophils Relative: 1 %
Eosinophils Absolute: 0.1 10*3/uL (ref 0.0–0.5)
Eosinophils Relative: 2 %
HCT: 42.9 % (ref 39.0–52.0)
Hemoglobin: 15.6 g/dL (ref 13.0–17.0)
Immature Granulocytes: 0 %
Lymphocytes Relative: 43 %
Lymphs Abs: 2.4 10*3/uL (ref 0.7–4.0)
MCH: 31.1 pg (ref 26.0–34.0)
MCHC: 36.4 g/dL — ABNORMAL HIGH (ref 30.0–36.0)
MCV: 85.6 fL (ref 80.0–100.0)
Monocytes Absolute: 0.6 10*3/uL (ref 0.1–1.0)
Monocytes Relative: 11 %
Neutro Abs: 2.4 10*3/uL (ref 1.7–7.7)
Neutrophils Relative %: 43 %
Platelets: 218 10*3/uL (ref 150–400)
RBC: 5.01 MIL/uL (ref 4.22–5.81)
RDW: 12.8 % (ref 11.5–15.5)
WBC: 5.4 10*3/uL (ref 4.0–10.5)
nRBC: 0 % (ref 0.0–0.2)

## 2022-11-13 LAB — COMPREHENSIVE METABOLIC PANEL
ALT: 12 U/L (ref 0–44)
AST: 14 U/L — ABNORMAL LOW (ref 15–41)
Albumin: 4.3 g/dL (ref 3.5–5.0)
Alkaline Phosphatase: 50 U/L (ref 38–126)
Anion gap: 9 (ref 5–15)
BUN: 19 mg/dL (ref 8–23)
CO2: 25 mmol/L (ref 22–32)
Calcium: 9.3 mg/dL (ref 8.9–10.3)
Chloride: 103 mmol/L (ref 98–111)
Creatinine, Ser: 1.24 mg/dL (ref 0.61–1.24)
GFR, Estimated: >60 mL/min (ref >60)
Glucose, Bld: 88 mg/dL (ref 70–99)
Potassium: 4.4 mmol/L (ref 3.5–5.1)
Sodium: 137 mmol/L (ref 135–145)
Total Bilirubin: 0.8 mg/dL (ref 0.3–1.2)
Total Protein: 7.6 g/dL (ref 6.5–8.1)

## 2022-11-13 LAB — URINALYSIS, ROUTINE W REFLEX MICROSCOPIC
Bacteria, UA: NONE SEEN
Bilirubin Urine: NEGATIVE
Glucose, UA: NEGATIVE mg/dL
Ketones, ur: NEGATIVE mg/dL
Leukocytes,Ua: NEGATIVE
Nitrite: NEGATIVE
RBC / HPF: >50 RBC/hpf (ref 0–5)
Specific Gravity, Urine: 1.019 (ref 1.005–1.030)
pH: 7 (ref 5.0–8.0)

## 2022-11-13 MED ORDER — SODIUM CHLORIDE 0.9 % IV BOLUS
1000.0000 mL | Freq: Once | INTRAVENOUS | Status: AC
  Administered 2022-11-13: 1000 mL via INTRAVENOUS

## 2022-11-13 MED ORDER — KETOROLAC TROMETHAMINE 30 MG/ML IJ SOLN
30.0000 mg | Freq: Once | INTRAMUSCULAR | Status: AC
  Administered 2022-11-13: 30 mg via INTRAVENOUS
  Filled 2022-11-13: qty 1

## 2022-11-13 NOTE — ED Triage Notes (Signed)
  Patient comes in with R flank pain from possible kidney stone that started yesterday.  Patient states he has had them before and tried flushing it out at home but got worse after driving earlier today.  No OTC meds for pain.  Endorses hematuria and nausea.  Pain 6/10, sharp.

## 2022-11-13 NOTE — ED Provider Notes (Signed)
[place] EMERGENCY DEPARTMENT AT [facility] Provider Note   CSN: [id number] Arrival date & time: 11/13/22  1945     History {Add pertinent medical, surgical, social history, OB history to HPI:1} Chief Complaint  Patient presents with   Flank Pain    Alan [name] is a 72 y.o. male.  With a past medical history of kidney stones and hypertension who presents to the ED for flank pain.  Right-sided flank pain began last night.  He was able to sleep for few hours but the pain got worse today.  Made worse with movement.  He did note hematuria as well.  He is able to void to completion.  No fevers chills chest pain or shortness of breath.  Last kidney stone was a couple years ago.  He was previously on Flomax but is no longer taking this.  In comparison to prior episodes of kidney stones he states this 1 "is not as bad"   Flank Pain       Home Medications Prior to Admission medications   Medication Sig Start Date End Date Taking? Authorizing Provider  amLODipine (NORVASC) 5 MG tablet TAKE 1 TABLET DAILY 04/06/22   Arnette Felts, FNP  Ascorbic Acid (VITAMIN C) 100 MG tablet Take 100 mg by mouth daily.    [provider]  aspirin EC 81 MG tablet Take 81 mg by mouth daily. Swallow whole.    [provider]  cholecalciferol (VITAMIN D3) 25 MCG (1000 UNIT) tablet Take 1,000 Units by mouth daily.    [provider]  sildenafil (VIAGRA) 100 MG tablet Take 1 tablet (100 mg total) by mouth daily as needed for erectile dysfunction. 05/22/20   Arnette Felts, FNP  Timolol Maleate 0.5 % (DAILY) SOLN Place 1 drop into both eyes at bedtime. 07/14/17   [provider]  Travoprost, BAK Free, (TRAVATAN) 0.004 % SOLN ophthalmic solution Place 1 drop into both eyes at bedtime.    [provider]  valACYclovir (VALTREX) 500 MG tablet Take 1 tablet (500 mg total) by mouth 2 (two) times daily. 05/22/20   Arnette Felts, FNP  zinc gluconate 50 MG tablet Take 50  mg by mouth daily.    [provider]      Allergies    Patient has no known allergies.    Review of Systems   Review of Systems  Genitourinary:  Positive for flank pain.    Physical Exam Updated Vital Signs BP (!) 179/98 (BP Location: Right Arm)   Pulse 60   Temp 98.1 F (36.7 C) (Oral)   Resp 18   Ht 5' 10.5" (1.791 m)   Wt 82.6 kg   SpO2 98%   BMI 25.75 kg/m  Physical Exam Vitals and nursing note reviewed.  HENT:     Head: Normocephalic and atraumatic.  Eyes:     Pupils: Pupils are equal, round, and reactive to light.  Cardiovascular:     Rate and Rhythm: Normal rate and regular rhythm.  Pulmonary:     Effort: Pulmonary effort is normal.     Breath sounds: Normal breath sounds.  Abdominal:     Palpations: Abdomen is soft.     Tenderness: There is no abdominal tenderness. There is no right CVA tenderness, left CVA tenderness, guarding or rebound.  Skin:    General: Skin is warm and dry.  Neurological:     Mental Status: He is alert.  Psychiatric:        Mood and Affect: Mood  normal.     ED Results / Procedures / Treatments   Labs (all labs ordered are listed, but only abnormal results are displayed) Labs Reviewed  URINALYSIS, ROUTINE W REFLEX MICROSCOPIC - Abnormal; Notable for the following components:      Result Value   Hgb urine dipstick LARGE (*)    Protein, ur TRACE (*)    All other components within normal limits  CBC WITH DIFFERENTIAL/PLATELET - Abnormal; Notable for the following components:   MCHC 36.4 (*)    All other components within normal limits  COMPREHENSIVE METABOLIC PANEL    EKG None  Radiology No results found.  Procedures Procedures  {Document cardiac monitor, telemetry assessment procedure when appropriate:1}  Medications Ordered in ED Medications  sodium chloride 0.9 % bolus 1,000 mL (1,000 mLs Intravenous New Bag/Given 11/13/22 2109)  ketorolac (TORADOL) 30 MG/ML injection 30 mg (30 mg Intravenous Given  11/13/22 2106)    ED Course/ Medical Decision Making/ A&P   {   Click here for ABCD2, HEART and other calculatorsREFRESH Note before signing :1}                              Medical Decision Making 72 year old male with history of kidney stones presents with right flank pain and hematuria.  He appears uncomfortable standing when I enter the room.  Afebrile and hypertensive.  Presentation certainly most consistent with kidney stone.  We will obtain laboratory workup with getting urinalysis as well as a CT without contrast to evaluate for potential obstructive stone.  Will provide IV fluids analgesia with Toradol and opioids if needed  Amount and/or Complexity of Data Reviewed Labs: ordered. Radiology: ordered.  Risk Prescription drug management.   ***  {Document critical care time when appropriate:1} {Document review of labs and clinical decision tools ie heart score, Chads2Vasc2 etc:1}  {Document your independent review of radiology images, and any outside records:1} {Document your discussion with family members, caretakers, and with consultants:1} {Document social determinants of health affecting pt's care:1} {Document your decision making why or why not admission, treatments were needed:1} Final Clinical Impression(s) / ED Diagnoses Final diagnoses:  None    Rx / DC Orders ED Discharge Orders     None

## 2022-11-14 MED ORDER — MORPHINE SULFATE 15 MG PO TABS
15.0000 mg | ORAL_TABLET | ORAL | 0 refills | Status: AC | PRN
Start: 2022-11-14 — End: 2022-11-17

## 2022-11-14 MED ORDER — TAMSULOSIN HCL 0.4 MG PO CAPS: 0.4000 mg | ORAL_CAPSULE | Freq: Every day | ORAL | 0 refills | Status: AC

## 2022-11-14 NOTE — Discharge Instructions (Signed)
You were seen in the emergency department for right flank pain We did see a kidney stone on the right side Fortunately there is no infection in your urine and your pain is well-controlled You do not need to stay in the hospital for this tonight but it is important that you follow-up with urology within the next week We have called in a prescription for you to begin taking Flomax.  Please pick up this medication and take as directed We have also called in a short course of morphine for you to take for severe pain Take Tylenol and/or Motrin as directed for mild to moderate pain Do not drink alcohol or drive while taking morphine Return to the emergency department for fevers, pain that is not controlled at home, if you are unable to urinate or have any other concerns

## 2022-12-28 ENCOUNTER — Other Ambulatory Visit (HOSPITAL_BASED_OUTPATIENT_CLINIC_OR_DEPARTMENT_OTHER): Payer: Medicare Other

## 2022-12-28 NOTE — Progress Notes (Signed)
Madelaine Bhat, CMA,acting as a Neurosurgeon for Arnette Felts, FNP.,have documented all relevant documentation on the behalf of Arnette Felts, FNP,as directed by  Arnette Felts, FNP while in the presence of Arnette Felts, FNP.  Subjective:  Patient ID: Alan [name] , male    DOB: [birth year] , 72 y.o.   MRN: [id number]  Chief Complaint  Patient presents with   Hypertension    HPI  Patient presents today for a bp and chol follow up, Patient reports compliance with medication. Patient denies any chest pain, SOB, or headaches. Patient would like his PSA checked but is not having any issues. Patient reports he still has ringing in his ears everyday. He has not been to the tinnitis clinic, number was given.      Past Medical History:  Diagnosis Date   CAD in native artery 03/24/2022   Cataracts, bilateral    Family history of hypertrophic cardiomyopathy 12/15/2021   Glaucoma    Renal disorder    kideny stone     Family History  Problem Relation Age of Onset   Heart attack Mother    Heart disease Mother    Diabetes Mother    Stroke Mother    Dementia Mother    Prostate cancer Father    Diabetes Sister    Hypertrophic cardiomyopathy Sister    Diabetes Sister    Colon cancer Brother    Heart attack Maternal Grandfather      Current Outpatient Medications:    amLODipine (NORVASC) 5 MG tablet, TAKE 1 TABLET [name], Disp: 90 tablet, Rfl: 3   Ascorbic Acid (VITAMIN C) 100 MG tablet, Take 100 mg by mouth [name]., Disp: , Rfl:    aspirin EC 81 MG tablet, Take 81 mg by mouth [name]. Swallow whole., Disp: , Rfl:    cholecalciferol (VITAMIN D3) 25 MCG (1000 UNIT) tablet, Take 1,000 Units by mouth [name]., Disp: , Rfl:    sildenafil (VIAGRA) 100 MG tablet, Take 1 tablet (100 mg total) by mouth [name] as needed for erectile dysfunction., Disp: 10 tablet, Rfl: 3   Timolol Maleate 0.5 % ([name]) SOLN, Place 1 drop into both eyes at bedtime., Disp: , Rfl:    Travoprost, BAK Free, (TRAVATAN) 0.004 % SOLN  ophthalmic solution, Place 1 drop into both eyes at bedtime., Disp: , Rfl:    valACYclovir (VALTREX) 500 MG tablet, Take 1 tablet (500 mg total) by mouth 2 (two) times [name]., Disp: 180 tablet, Rfl: 0   zinc gluconate 50 MG tablet, Take 50 mg by mouth [name]., Disp: , Rfl:    No Known Allergies   Review of Systems  Constitutional: Negative.   HENT:  Positive for tinnitus (worsening to [name] and will awaken him at night).   Eyes: Negative.   Respiratory: Negative.    Cardiovascular: Negative.   Gastrointestinal: Negative.   Psychiatric/Behavioral: Negative.       Today's Vitals   12/29/22 1527  BP: 130/80  Pulse: 80  Temp: 97.7 F (36.5 C)  TempSrc: Oral  Weight: 185 lb 9.6 oz (84.2 kg)  Height: 5\' 10"  (1.778 m)  PainSc: 0-No pain   Body mass index is 26.63 kg/m.  Wt Readings from Last 3 Encounters:  12/29/22 185 lb 9.6 oz (84.2 kg)  11/13/22 182 lb (82.6 kg)  08/27/22 182 lb (82.6 kg)    Objective:  Physical Exam Vitals reviewed.  Constitutional:      General: He is not in acute distress.    Appearance: Normal appearance.  Cardiovascular:  Rate and Rhythm: Normal rate and regular rhythm.     Pulses: Normal pulses.     Heart sounds: Normal heart sounds. No murmur heard. Pulmonary:     Effort: Pulmonary effort is normal. No respiratory distress.     Breath sounds: Normal breath sounds. No wheezing.  Skin:    General: Skin is warm and dry.     Capillary Refill: Capillary refill takes less than 2 seconds.  Neurological:     General: No focal deficit present.     Mental Status: He is alert and oriented to person, place, and time.     Cranial Nerves: No cranial nerve deficit.     Motor: No weakness.  Psychiatric:        Mood and Affect: Mood normal.        Behavior: Behavior normal.        Thought Content: Thought content normal.        Judgment: Judgment normal.         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 Continues to be followed by Dr. Duke Salvia  Orders: -     Lipid panel  Herpes zoster vaccination declined Assessment & Plan: Declines shingrix, educated on disease process and is aware if he changes his mind to notify office    COVID-19 vaccine administered Assessment & Plan: Covid 19 vaccine given in office observed for 15 minutes without any adverse reaction   Orders: -     [employer] Comirnaty Covid-19 Vaccine 40yrs & older  Nocturia Assessment & Plan: Will check PSA    Family history of prostate cancer -     PSA Total (Reflex To Free)    Return for 6 month bp check.  Patient was given opportunity to ask questions. Patient verbalized understanding of the plan and was able to repeat key elements of the plan. All questions were answered to their satisfaction.   Jeanell Sparrow, FNP, have reviewed all documentation for this visit. The documentation on 12/29/22 for the exam, diagnosis, procedures, and orders are all accurate and complete.   IF YOU HAVE BEEN REFERRED TO A SPECIALIST, IT MAY TAKE 1-2 WEEKS TO SCHEDULE/PROCESS THE REFERRAL. IF YOU HAVE NOT HEARD FROM US/SPECIALIST IN TWO WEEKS, PLEASE GIVE [place] A CALL AT [phone].

## 2022-12-29 ENCOUNTER — Encounter: Payer: Self-pay | Admitting: Nurse Practitioner

## 2022-12-29 ENCOUNTER — Ambulatory Visit (INDEPENDENT_AMBULATORY_CARE_PROVIDER_SITE_OTHER): Payer: Medicare Other | Admitting: Nurse Practitioner

## 2022-12-29 VITALS — BP 130/80 | HR 80 | Temp 97.7°F | Ht 70.0 in | Wt 185.6 lb

## 2022-12-29 DIAGNOSIS — Z2821 Immunization not carried out because of patient refusal: Secondary | ICD-10-CM | POA: Diagnosis not present

## 2022-12-29 DIAGNOSIS — R351 Nocturia: Secondary | ICD-10-CM | POA: Diagnosis not present

## 2022-12-29 DIAGNOSIS — Z23 Encounter for immunization: Secondary | ICD-10-CM | POA: Diagnosis not present

## 2022-12-29 DIAGNOSIS — E7800 Pure hypercholesterolemia, unspecified: Secondary | ICD-10-CM

## 2022-12-29 DIAGNOSIS — Z8042 Family history of malignant neoplasm of prostate: Secondary | ICD-10-CM | POA: Diagnosis not present

## 2022-12-29 DIAGNOSIS — I10 Essential (primary) hypertension: Secondary | ICD-10-CM | POA: Diagnosis not present

## 2022-12-29 DIAGNOSIS — I119 Hypertensive heart disease without heart failure: Secondary | ICD-10-CM

## 2022-12-29 NOTE — Patient Instructions (Signed)
You can take red yeast rice supplement for elevated cholesterol

## 2022-12-30 LAB — LIPID PANEL
Chol/HDL Ratio: 5 ratio (ref 0.0–5.0)
Cholesterol, Total: 224 mg/dL — ABNORMAL HIGH (ref 100–199)
HDL: 45 mg/dL (ref >39)
LDL Chol Calc (NIH): 162 mg/dL — ABNORMAL HIGH (ref 0–99)
Triglycerides: 96 mg/dL (ref 0–149)
VLDL Cholesterol Cal: 17 mg/dL (ref 5–40)

## 2022-12-30 LAB — BMP8+EGFR
BUN/Creatinine Ratio: 15 (ref 10–24)
BUN: 15 mg/dL (ref 8–27)
CO2: 23 mmol/L (ref 20–29)
Calcium: 9.5 mg/dL (ref 8.6–10.2)
Chloride: 104 mmol/L (ref 96–106)
Creatinine, Ser: 1.03 mg/dL (ref 0.76–1.27)
Glucose: 84 mg/dL (ref 70–99)
Potassium: 4.6 mmol/L (ref 3.5–5.2)
Sodium: 141 mmol/L (ref 134–144)
eGFR: 77 mL/min/{1.73_m2} (ref >59)

## 2022-12-30 LAB — PSA TOTAL (REFLEX TO FREE): Prostate Specific Ag, Serum: 1.8 ng/mL (ref 0.0–4.0)

## 2023-01-04 DIAGNOSIS — R351 Nocturia: Secondary | ICD-10-CM | POA: Insufficient documentation

## 2023-01-04 DIAGNOSIS — Z23 Encounter for immunization: Secondary | ICD-10-CM | POA: Insufficient documentation

## 2023-01-04 NOTE — Assessment & Plan Note (Signed)
Chronic, his calcium risk score was 5 however he continues working on diet and exercise. Continues to be followed by Dr. Duke Salvia

## 2023-01-04 NOTE — Assessment & Plan Note (Signed)
Will check PSA

## 2023-01-04 NOTE — Assessment & Plan Note (Signed)
Blood pressure is controlled, at border encouraged to focus on lifestyle modifications

## 2023-01-04 NOTE — Assessment & Plan Note (Deleted)
Blood pressure is controlled, at border encouraged to focus on lifestyle modifications

## 2023-01-04 NOTE — Assessment & Plan Note (Signed)
Declines shingrix, educated on disease process and is aware if he changes his mind to notify office  

## 2023-01-04 NOTE — Assessment & Plan Note (Signed)
Covid 19 vaccine given in office observed for 15 minutes without any adverse reaction  

## 2023-03-03 ENCOUNTER — Ambulatory Visit: Payer: Medicare Other

## 2023-03-03 VITALS — BP 128/80 | HR 65 | Temp 98.4°F | Ht 68.8 in | Wt 189.0 lb

## 2023-03-03 DIAGNOSIS — Z0000 Encounter for general adult medical examination without abnormal findings: Secondary | ICD-10-CM

## 2023-03-03 NOTE — Progress Notes (Signed)
Subjective:   Alan [name] is a 73 y.o. male who presents for Medicare Annual/Subsequent preventive examination.  Visit Complete: In person    Cardiac Risk Factors include: advanced age (>87men, >52 women);hypertension;male gender     Objective:    Today's Vitals   03/03/23 1541  BP: 128/80  Pulse: 65  Temp: 98.4 F (36.9 C)  TempSrc: Oral  SpO2: 99%  Weight: 189 lb (85.7 kg)  Height: 5' 8.8" (1.748 m)   Body mass index is 28.07 kg/m.     03/03/2023    3:47 PM 11/13/2022    7:52 PM 02/26/2022    2:47 PM 01/29/2021    3:03 PM 01/25/2020    2:39 PM 01/19/2019    3:25 PM 12/22/2017    4:01 PM  Advanced Directives  Does Patient Have a Medical Advance Directive? No No No No No No No  Would patient like information on creating a medical advance directive? No - Patient declined No - Patient declined No - Patient declined No - Patient declined Yes (MAU/Ambulatory/Procedural Areas - Information given) Yes (MAU/Ambulatory/Procedural Areas - Information given) Yes (MAU/Ambulatory/Procedural Areas - Information given)    Current Medications (verified) Outpatient Encounter Medications as of 03/03/2023  Medication Sig   amLODipine (NORVASC) 5 MG tablet TAKE 1 TABLET DAILY   Ascorbic Acid (VITAMIN C) 100 MG tablet Take 100 mg by mouth daily.   aspirin EC 81 MG tablet Take 81 mg by mouth daily. Swallow whole.   cholecalciferol (VITAMIN D3) 25 MCG (1000 UNIT) tablet Take 1,000 Units by mouth daily.   sildenafil (VIAGRA) 100 MG tablet Take 1 tablet (100 mg total) by mouth daily as needed for erectile dysfunction.   Timolol Maleate 0.5 % (DAILY) SOLN Place 1 drop into both eyes at bedtime.   Travoprost, BAK Free, (TRAVATAN) 0.004 % SOLN ophthalmic solution Place 1 drop into both eyes at bedtime.   valACYclovir (VALTREX) 500 MG tablet Take 1 tablet (500 mg total) by mouth 2 (two) times daily.   zinc gluconate 50 MG tablet Take 50 mg by mouth daily.   No facility-administered  encounter medications on file as of 03/03/2023.    Allergies (verified) Patient has no known allergies.   History: Past Medical History:  Diagnosis Date   CAD in native artery 03/24/2022   Cataracts, bilateral    Family history of hypertrophic cardiomyopathy 12/15/2021   Glaucoma    Renal disorder    kideny stone   History reviewed. No pertinent surgical history. Family History  Problem Relation Age of Onset   Heart attack Mother    Heart disease Mother    Diabetes Mother    Stroke Mother    Dementia Mother    Prostate cancer Father    Diabetes Sister    Hypertrophic cardiomyopathy Sister    Diabetes Sister    Colon cancer Brother    Heart attack Maternal Grandfather    Social History   Socioeconomic History   Marital status: Married    Spouse name: Not on file   Number of children: Not on file   Years of education: Not on file   Highest education level: Some college, no degree  Occupational History   Not on file  Tobacco Use   Smoking status: Former    Current packs/day: 0.00    Types: Cigarettes    Quit date: 02/09/1981    Years since quitting: 42.0   Smokeless tobacco: Never  Vaping Use   Vaping status: Never Used  Substance and Sexual Activity   Alcohol use: Yes    Comment: socially   Drug use: Never   Sexual activity: Yes  Other Topics Concern   Not on file  Social History Narrative   Not on file   Social Drivers of Health   Financial Resource Strain: Low Risk  (03/03/2023)   Overall Financial Resource Strain (CARDIA)    Difficulty of Paying Living Expenses: Not hard at all  Food Insecurity: No Food Insecurity (03/03/2023)   Hunger Vital Sign    Worried About Running Out of Food in the Last Year: Never true    Ran Out of Food in the Last Year: Never true  Transportation Needs: No Transportation Needs (03/03/2023)   PRAPARE - Administrator, Civil Service (Medical): No    Lack of Transportation (Non-Medical): No  Physical Activity:  Insufficiently Active (03/03/2023)   Exercise Vital Sign    Days of Exercise per Week: 2 days    Minutes of Exercise per Session: 60 min  Stress: No Stress Concern Present (03/03/2023)   [name] of Occupational Health - Occupational Stress Questionnaire    Feeling of Stress : Only a little  Social Connections: Moderately Integrated (03/03/2023)   Social Connection and Isolation Panel [NHANES]    Frequency of Communication with Friends and Family: More than three times a week    Frequency of Social Gatherings with Friends and Family: More than three times a week    Attends Religious Services: 1 to 4 times per year    Active Member of [employer] or Organizations: No    Attends Engineer, structural: Never    Marital Status: Married    Tobacco Counseling Counseling given: Not Answered   Clinical Intake:  Pre-visit preparation completed: Yes  Pain : No/denies pain     Nutritional Risks: None Diabetes: No  How often do you need to have someone help you when you read instructions, pamphlets, or other written materials from your doctor or pharmacy?: 1 - Never  Interpreter Needed?: No  Information entered by :: NAllen LPN   Activities of Daily Living    03/03/2023    3:42 PM  In your present state of health, do you have any difficulty performing the following activities:  Hearing? 0  Comment ringing in ears  Vision? 1  Comment blurry vision sometimes  Difficulty concentrating or making decisions? 0  Walking or climbing stairs? 0  Dressing or bathing? 0  Doing errands, shopping? 0  Preparing Food and eating ? N  Using the Toilet? N  In the past six months, have you accidently leaked urine? N  Do you have problems with loss of bowel control? N  Managing your Medications? N  Managing your Finances? N  Housekeeping or managing your Housekeeping? N    Patient Care Team: Arnette Felts, FNP as PCP - General (General Practice) Chilton Si, MD as PCP -  Cardiology (Cardiology) Arnette Felts, FNP (General Practice)  Indicate any recent Medical Services you may have received from other than [facility] providers in the past year (date may be approximate).     Assessment:   This is a routine wellness examination for Alan [name].  Hearing/Vision screen Hearing Screening - Comments:: Denies hearing issues Vision Screening - Comments:: [facility]   Goals Addressed             This Visit's Progress    Patient Stated       03/03/2023, wants to get  back to riding bike       Depression Screen    03/03/2023    3:49 PM 08/27/2022    2:31 PM 02/26/2022    2:48 PM 10/29/2021    2:58 PM 01/29/2021    3:04 PM 01/29/2021    2:40 PM 01/25/2020    2:40 PM  PHQ 2/9 Scores  PHQ - 2 Score 0 0 0 0 0 0 0  PHQ- 9 Score  0    0     Fall Risk    03/03/2023    3:48 PM 02/26/2022    2:48 PM 10/29/2021    2:57 PM 01/29/2021    3:04 PM 01/29/2021    2:41 PM  Fall Risk   Falls in the past year? 0 0 0 0 0  Number falls in past yr: 0 0 0  0  Injury with Fall? 0 0 0  0  Risk for fall due to : Medication side effect Medication side effect No Fall Risks Medication side effect   Follow up Falls prevention discussed;Falls evaluation completed Falls prevention discussed;Education provided;Falls evaluation completed Falls evaluation completed Falls evaluation completed;Education provided;Falls prevention discussed     MEDICARE RISK AT HOME: Medicare Risk at Home Any stairs in or around the home?: No If so, are there any without handrails?: No Home free of loose throw rugs in walkways, pet beds, electrical cords, etc?: Yes Adequate lighting in your home to reduce risk of falls?: Yes Life alert?: No Use of a cane, walker or w/c?: No Grab bars in the bathroom?: No Shower chair or bench in shower?: No Elevated toilet seat or a handicapped toilet?: Yes  TIMED UP AND GO:  Was the test performed?  Yes  Length of time to ambulate 10 feet: 5 sec Gait  steady and fast without use of assistive device    Cognitive Function:        03/03/2023    3:50 PM 02/26/2022    2:49 PM 01/29/2021    3:05 PM 01/25/2020    2:40 PM 01/19/2019    3:27 PM  6CIT Screen  What Year? 0 points 0 points 0 points 0 points 0 points  What month? 0 points 0 points 0 points 0 points 0 points  What time? 0 points 0 points 0 points 0 points 0 points  Count back from 20 0 points 0 points 0 points 0 points 0 points  Months in reverse 0 points 0 points 0 points 0 points 0 points  Repeat phrase 0 points 0 points 2 points 0 points 0 points  Total Score 0 points 0 points 2 points 0 points 0 points    Immunizations Immunization History  Administered Date(s) Administered   DTaP 11/24/2012   Fluad Quad(high Dose 65+) 10/29/2021   Influenza, High Dose Seasonal PF 11/24/2018, 09/23/2020   Influenza-Unspecified 11/22/2017, 10/25/2019   Moderna Covid-19 Fall Seasonal Vaccine 25yrs & older 06/12/2022   Moderna Sars-Covid-2 Vaccination 02/24/2019, 03/24/2019, 02/01/2020, 09/23/2020   PNEUMOCOCCAL CONJUGATE-20 10/29/2021   Pfizer(Comirnaty)Fall Seasonal Vaccine 12 years and older 12/29/2022   Pneumococcal Polysaccharide-23 05/22/2020   Tdap 02/04/2017    TDAP status: Up to date  Flu Vaccine status: Up to date  Pneumococcal vaccine status: Up to date  Covid-19 vaccine status: Completed vaccines  Qualifies for Shingles Vaccine? Yes   Zostavax completed No   Shingrix Completed?: No.    Education has been provided regarding the importance of this vaccine. Patient has been advised to call insurance company  to determine out of pocket expense if they have not yet received this vaccine. Advised may also receive vaccine at local pharmacy or Health Dept. Verbalized acceptance and understanding.  Screening Tests Health Maintenance  Topic Date Due   INFLUENZA VACCINE  09/10/2022   Zoster Vaccines- Shingrix (1 of 2) 03/31/2023 (Originally 06/20/2000)   Medicare Annual  Wellness (AWV)  03/02/2024   DTaP/Tdap/Td (3 - Td or Tdap) 02/05/2027   Colonoscopy  09/15/2028   Pneumonia Vaccine 12+ Years old  Completed   COVID-19 Vaccine  Completed   Hepatitis C Screening  Completed   HPV VACCINES  Aged Out    Health Maintenance  Health Maintenance Due  Topic Date Due   INFLUENZA VACCINE  09/10/2022    Colorectal cancer screening: Type of screening: Colonoscopy. Completed 09/16/2018. Repeat every 10 years  Lung Cancer Screening: (Low Dose CT Chest recommended if Age 61-80 years, 20 pack-year currently smoking OR have quit w/in 15years.) does not qualify.   Lung Cancer Screening Referral: no  Additional Screening:  Hepatitis C Screening: does qualify; Completed 12/22/2017  Vision Screening: Recommended annual ophthalmology exams for early detection of glaucoma and other disorders of the eye. Is the patient up to date with their annual eye exam?  Yes  Who is the provider or what is the name of the office in which the patient attends annual [facility] exams? [facility] If pt is not established with a provider, would they like to be referred to a provider to establish care? No .   Dental Screening: Recommended annual dental exams for proper oral hygiene  Diabetic Foot Exam: n/a  Community Resource Referral / Chronic Care Management: CRR required this visit?  No   CCM required this visit?  No     Plan:     I have personally reviewed and noted the following in the patient's chart:   Medical and social history Use of alcohol, tobacco or illicit drugs  Current medications and supplements including opioid prescriptions. Patient is not currently taking opioid prescriptions. Functional ability and status Nutritional status Physical activity Advanced directives List of other physicians Hospitalizations, surgeries, and ER visits in previous 12 months Vitals Screenings to include cognitive, depression, and falls Referrals and appointments  In addition, I have  reviewed and discussed with patient certain preventive protocols, quality metrics, and best practice recommendations. A written personalized care plan for preventive services as well as general preventive health recommendations were provided to patient.     Barb Merino, LPN   9/60/4540   After Visit Summary: (In Person-Printed) AVS printed and given to the patient  Nurse Notes: none

## 2023-03-03 NOTE — Patient Instructions (Signed)
Alan [name] , Thank you for taking time to come for your Medicare Wellness Visit. I appreciate your ongoing commitment to your health goals. Please review the following plan we discussed and let me know if I can assist you in the future.   Referrals/Orders/Follow-Ups/Clinician Recommendations: none  This is a list of the screening recommended for you and due dates:  Health Maintenance  Topic Date Due   Flu Shot  09/10/2022   Zoster (Shingles) Vaccine (1 of 2) 03/31/2023*   Medicare Annual Wellness Visit  03/02/2024   DTaP/Tdap/Td vaccine (3 - Td or Tdap) 02/05/2027   Colon Cancer Screening  09/15/2028   Pneumonia Vaccine  Completed   COVID-19 Vaccine  Completed   Hepatitis C Screening  Completed   HPV Vaccine  Aged Out  *Topic was postponed. The date shown is not the original due date.    Advanced directives: (Declined) Advance directive discussed with you today. Even though you declined this today, please call our office should you change your mind, and we can give you the proper paperwork for you to fill out.  Next Medicare Annual Wellness Visit scheduled for next year: Yes  insert Preventive Care attachment Insert FALL PREVENTION attachment if needed

## 2023-03-04 ENCOUNTER — Ambulatory Visit (HOSPITAL_BASED_OUTPATIENT_CLINIC_OR_DEPARTMENT_OTHER): Payer: Medicare Other

## 2023-03-04 DIAGNOSIS — I2510 Atherosclerotic heart disease of native coronary artery without angina pectoris: Secondary | ICD-10-CM | POA: Diagnosis not present

## 2023-03-04 DIAGNOSIS — I10 Essential (primary) hypertension: Secondary | ICD-10-CM | POA: Diagnosis not present

## 2023-03-04 DIAGNOSIS — Z8249 Family history of ischemic heart disease and other diseases of the circulatory system: Secondary | ICD-10-CM | POA: Diagnosis not present

## 2023-03-04 LAB — ECHOCARDIOGRAM COMPLETE
Area-P 1/2: 2.6 cm2
S' Lateral: 2.31 cm

## 2023-06-28 ENCOUNTER — Other Ambulatory Visit: Payer: Self-pay | Admitting: Nurse Practitioner

## 2023-06-29 ENCOUNTER — Ambulatory Visit (INDEPENDENT_AMBULATORY_CARE_PROVIDER_SITE_OTHER): Payer: Medicare Other | Admitting: Nurse Practitioner

## 2023-06-29 VITALS — BP 132/82 | HR 66 | Temp 98.6°F | Ht 68.0 in | Wt 186.0 lb

## 2023-06-29 DIAGNOSIS — E7800 Pure hypercholesterolemia, unspecified: Secondary | ICD-10-CM

## 2023-06-29 DIAGNOSIS — Z2821 Immunization not carried out because of patient refusal: Secondary | ICD-10-CM | POA: Diagnosis not present

## 2023-06-29 DIAGNOSIS — I119 Hypertensive heart disease without heart failure: Secondary | ICD-10-CM | POA: Diagnosis not present

## 2023-06-29 DIAGNOSIS — I2510 Atherosclerotic heart disease of native coronary artery without angina pectoris: Secondary | ICD-10-CM | POA: Diagnosis not present

## 2023-06-29 DIAGNOSIS — Z6828 Body mass index (BMI) 28.0-28.9, adult: Secondary | ICD-10-CM | POA: Diagnosis not present

## 2023-06-29 DIAGNOSIS — H9313 Tinnitus, bilateral: Secondary | ICD-10-CM

## 2023-06-29 DIAGNOSIS — E663 Overweight: Secondary | ICD-10-CM | POA: Diagnosis not present

## 2023-06-29 NOTE — Assessment & Plan Note (Signed)
 Diet controlled. Will recheck lipid panel

## 2023-06-29 NOTE — Assessment & Plan Note (Signed)
 No current medications

## 2023-06-29 NOTE — Assessment & Plan Note (Addendum)
 Symptoms are becoming more regular and lasting longer. Given the phone number to [facility] tinnitus clinic.

## 2023-06-29 NOTE — Assessment & Plan Note (Signed)
 Declines shingrix, educated on disease process and is aware if he changes his mind to notify office

## 2023-06-29 NOTE — Progress Notes (Signed)
 [place], CMA,acting as a Neurosurgeon for Alan Epley, FNP.,have documented all relevant documentation on the behalf of Alan Epley, FNP,as directed by  Alan Epley, FNP while in the presence of Alan Epley, FNP.  Subjective:  Patient ID: Alan [name] , male    DOB: November 01, 1950 , 73 y.o.   MRN: [id number]  Chief Complaint  Patient presents with   Hypertension    Patient presents today for a bp follow up, Patient reports compliance with medication. Patient denies any chest pain, SOB, or headaches. Patient has no concerns today.     HPI  He has been to the [place] and has started Sertraline for his nightmares. He is to have a medication sleep study done on June 3rd. He plans to get out and ride his bicycle for his exercise. His diet is mostly healthy with grilled and baked foods. He takes his blood pressure medications at night time     Past Medical History:  Diagnosis Date   CAD in native artery 03/24/2022   Cataracts, bilateral    Family history of hypertrophic cardiomyopathy 12/15/2021   Glaucoma    Renal disorder    kideny stone     Family History  Problem Relation Age of Onset   Heart attack Mother    Heart disease Mother    Diabetes Mother    Stroke Mother    Dementia Mother    Prostate cancer Father    Diabetes Sister    Hypertrophic cardiomyopathy Sister    Diabetes Sister    Colon cancer Brother    Heart attack Maternal Grandfather      Current Outpatient Medications:    amLODipine  (NORVASC ) 5 MG tablet, TAKE 1 TABLET DAILY, Disp: 90 tablet, Rfl: 3   Ascorbic Acid (VITAMIN C) 100 MG tablet, Take 100 mg by mouth daily., Disp: , Rfl:    aspirin EC 81 MG tablet, Take 81 mg by mouth daily. Swallow whole., Disp: , Rfl:    cholecalciferol (VITAMIN D3) 25 MCG (1000 UNIT) tablet, Take 1,000 Units by mouth daily., Disp: , Rfl:    sertraline (ZOLOFT) 25 MG tablet, Take 25 mg by mouth daily., Disp: , Rfl:    sildenafil  (VIAGRA ) 100 MG tablet, Take 1 tablet (100 mg total)  by mouth daily as needed for erectile dysfunction., Disp: 10 tablet, Rfl: 3   Timolol Maleate 0.5 % (DAILY) SOLN, Place 1 drop into both eyes at bedtime., Disp: , Rfl:    Travoprost, BAK Free, (TRAVATAN) 0.004 % SOLN ophthalmic solution, Place 1 drop into both eyes at bedtime., Disp: , Rfl:    valACYclovir  (VALTREX ) 500 MG tablet, Take 1 tablet (500 mg total) by mouth 2 (two) times daily., Disp: 180 tablet, Rfl: 0   zinc gluconate 50 MG tablet, Take 50 mg by mouth daily., Disp: , Rfl:    No Known Allergies   Review of Systems  Constitutional: Negative.   HENT:  Positive for tinnitus (worsening to daily and will awaken him at night).   Eyes: Negative.   Respiratory: Negative.    Cardiovascular: Negative.   Gastrointestinal: Negative.   Psychiatric/Behavioral: Negative.       Today's Vitals   06/29/23 1603  BP: 132/82  Pulse: 66  Temp: 98.6 F (37 C)  TempSrc: Oral  Weight: 186 lb (84.4 kg)  Height: 5\' 8"  (1.727 m)  PainSc: 0-No pain   Body mass index is 28.28 kg/m.  Wt Readings from Last 3 Encounters:  06/29/23 186 lb (84.4 kg)  03/03/23 189 lb (85.7 kg)  12/29/22 185 lb 9.6 oz (84.2 kg)     Objective:  Physical Exam Vitals and nursing note reviewed.  Constitutional:      General: He is not in acute distress.    Appearance: Normal appearance.  Cardiovascular:     Rate and Rhythm: Normal rate and regular rhythm.     Pulses: Normal pulses.     Heart sounds: Normal heart sounds. No murmur heard. Pulmonary:     Effort: Pulmonary effort is normal. No respiratory distress.     Breath sounds: Normal breath sounds. No wheezing.  Skin:    General: Skin is warm and dry.     Capillary Refill: Capillary refill takes less than 2 seconds.  Neurological:     General: No focal deficit present.     Mental Status: He is alert and oriented to person, place, and time.     Cranial Nerves: No cranial nerve deficit.     Motor: No weakness.  Psychiatric:        Mood and Affect: Mood  normal.        Behavior: Behavior normal.        Thought Content: Thought content normal.        Judgment: Judgment normal.     Assessment And Plan:  Hypertensive heart disease without heart failure Assessment & Plan: Blood pressure is fairly controlled. Continue current medications. He takes his medications at night  Orders: -     BMP8+eGFR  Pure hypercholesterolemia Assessment & Plan: Diet controlled. Will recheck lipid panel  Orders: -     Lipid panel  CAD in native artery Assessment & Plan: No current medications.    COVID-19 vaccination declined  Herpes zoster vaccination declined Assessment & Plan: Declines shingrix, educated on disease process and is aware if he changes his mind to notify office    Overweight with body mass index (BMI) of 28 to 28.9 in adult  Tinnitus of both ears Assessment & Plan: Symptoms are becoming more regular and lasting longer. Given the phone number to [facility] tinnitus clinic.     Return for 6 month bp check.   Patient was given opportunity to ask questions. Patient verbalized understanding of the plan and was able to repeat key elements of the plan. All questions were answered to their satisfaction.    Inge Mangle, FNP, have reviewed all documentation for this visit. The documentation on 06/29/23 for the exam, diagnosis, procedures, and orders are all accurate and complete.   IF YOU HAVE BEEN REFERRED TO A SPECIALIST, IT MAY TAKE 1-2 WEEKS TO SCHEDULE/PROCESS THE REFERRAL. IF YOU HAVE NOT HEARD FROM US /SPECIALIST IN TWO WEEKS, PLEASE GIVE US  A CALL AT [phone] X [phone].

## 2023-06-29 NOTE — Assessment & Plan Note (Signed)
 Blood pressure is fairly controlled. Continue current medications. He takes his medications at night

## 2023-06-29 NOTE — Patient Instructions (Signed)
 [facility] [phone]

## 2023-06-30 ENCOUNTER — Ambulatory Visit: Payer: Self-pay | Admitting: Nurse Practitioner

## 2023-06-30 LAB — BMP8+EGFR
BUN/Creatinine Ratio: 12 (ref 10–24)
BUN: 14 mg/dL (ref 8–27)
CO2: 24 mmol/L (ref 20–29)
Calcium: 9.1 mg/dL (ref 8.6–10.2)
Chloride: 103 mmol/L (ref 96–106)
Creatinine, Ser: 1.17 mg/dL (ref 0.76–1.27)
Glucose: 96 mg/dL (ref 70–99)
Potassium: 4.3 mmol/L (ref 3.5–5.2)
Sodium: 139 mmol/L (ref 134–144)
eGFR: 66 mL/min/{1.73_m2} (ref >59)

## 2023-06-30 LAB — LIPID PANEL
Chol/HDL Ratio: 4.7 ratio (ref 0.0–5.0)
Cholesterol, Total: 184 mg/dL (ref 100–199)
HDL: 39 mg/dL — ABNORMAL LOW
LDL Chol Calc (NIH): 128 mg/dL — ABNORMAL HIGH (ref 0–99)
Triglycerides: 93 mg/dL (ref 0–149)
VLDL Cholesterol Cal: 17 mg/dL (ref 5–40)

## 2023-08-01 ENCOUNTER — Emergency Department (HOSPITAL_BASED_OUTPATIENT_CLINIC_OR_DEPARTMENT_OTHER)
Admission: EM | Admit: 2023-08-01 | Discharge: 2023-08-01 | Disposition: A | Discharge status: Home / Self Care | Attending: Emergency Medicine | Admitting: Emergency Medicine

## 2023-08-01 ENCOUNTER — Emergency Department (HOSPITAL_BASED_OUTPATIENT_CLINIC_OR_DEPARTMENT_OTHER)

## 2023-08-01 ENCOUNTER — Encounter (HOSPITAL_BASED_OUTPATIENT_CLINIC_OR_DEPARTMENT_OTHER): Payer: Self-pay

## 2023-08-01 DIAGNOSIS — H1132 Conjunctival hemorrhage, left eye: Secondary | ICD-10-CM | POA: Insufficient documentation

## 2023-08-01 DIAGNOSIS — Z79899 Other long term (current) drug therapy: Secondary | ICD-10-CM | POA: Diagnosis not present

## 2023-08-01 DIAGNOSIS — S00212A Abrasion of left eyelid and periocular area, initial encounter: Secondary | ICD-10-CM | POA: Insufficient documentation

## 2023-08-01 DIAGNOSIS — S0993XA Unspecified injury of face, initial encounter: Secondary | ICD-10-CM | POA: Diagnosis not present

## 2023-08-01 DIAGNOSIS — W228XXA Striking against or struck by other objects, initial encounter: Secondary | ICD-10-CM | POA: Diagnosis not present

## 2023-08-01 DIAGNOSIS — I10 Essential (primary) hypertension: Secondary | ICD-10-CM | POA: Diagnosis not present

## 2023-08-01 DIAGNOSIS — Z7982 Long term (current) use of aspirin: Secondary | ICD-10-CM | POA: Diagnosis not present

## 2023-08-01 DIAGNOSIS — R600 Localized edema: Secondary | ICD-10-CM | POA: Diagnosis present

## 2023-08-01 MED ORDER — FLUORESCEIN SODIUM 1 MG OP STRP
1.0000 | ORAL_STRIP | Freq: Once | OPHTHALMIC | Status: AC
  Administered 2023-08-01: 1 via OPHTHALMIC
  Filled 2023-08-01: qty 1

## 2023-08-01 MED ORDER — TETRACAINE HCL 0.5 % OP SOLN
2.0000 [drp] | Freq: Once | OPHTHALMIC | Status: AC
  Administered 2023-08-01: 2 [drp] via OPHTHALMIC
  Filled 2023-08-01: qty 4

## 2023-08-01 NOTE — ED Notes (Signed)
 Visual acuity completed per order. Patient wears glasses and reports blurry vision in left eye.

## 2023-08-01 NOTE — ED Provider Notes (Signed)
 [facility] Provider Note   CSN: [id number] Arrival date & time: 08/01/23  9084     Patient presents with: Alan [name] is a 73 y.o. male with history of hypertension, presents with concern for hitting the left side of his face on the floor 2 days ago.  States he was sleeping and having a nightmare which caused him to jump out of bed and he landed on the floor hitting his face.  Denies any loss of consciousness, denies any blood thinners.  Reports his vision was initially little bit blurry after this incident, but this is since resolved.  Denies any headaches, vomiting.  Denies any eye pain.    Fall       Prior to Admission medications   Medication Sig Start Date End Date Taking? Authorizing Provider  amLODipine  (NORVASC ) 5 MG tablet TAKE 1 TABLET DAILY 06/28/23   Georgina Speaks, FNP  Ascorbic Acid (VITAMIN C) 100 MG tablet Take 100 mg by mouth daily.    [provider]  aspirin EC 81 MG tablet Take 81 mg by mouth daily. Swallow whole.    [provider]  cholecalciferol (VITAMIN D3) 25 MCG (1000 UNIT) tablet Take 1,000 Units by mouth daily.    [provider]  sertraline (ZOLOFT) 25 MG tablet Take 25 mg by mouth daily. 06/11/23   [provider]  sildenafil  (VIAGRA ) 100 MG tablet Take 1 tablet (100 mg total) by mouth daily as needed for erectile dysfunction. 05/22/20   Moore, Janece, FNP  Timolol Maleate 0.5 % (DAILY) SOLN Place 1 drop into both eyes at bedtime. 07/14/17   [provider]  Travoprost, BAK Free, (TRAVATAN) 0.004 % SOLN ophthalmic solution Place 1 drop into both eyes at bedtime.    [provider]  valACYclovir  (VALTREX ) 500 MG tablet Take 1 tablet (500 mg total) by mouth 2 (two) times daily. 05/22/20   Georgina Speaks, FNP  zinc gluconate 50 MG tablet Take 50 mg by mouth daily.    [provider]    Allergies: Patient has no known allergies.    Review of  Systems  Musculoskeletal:        Facial swelling    Updated Vital Signs BP (!) 165/93 (BP Location: Left Arm)   Pulse 63   Temp 98 F (36.7 C)   Resp 18   Wt 82.6 kg   SpO2 98%   BMI 27.67 kg/m   Physical Exam Vitals and nursing note reviewed.  Constitutional:      Appearance: Normal appearance.  HENT:     Head: Normocephalic and atraumatic.      Comments: Mild edema under the left eye.  No hematoma.  Superficial scabbed over abrasion to the left eyebrow.  No surrounding edema.  Mild tenderness of the left zygomatic bone without step-off noted.  Eyes:      Comments: Able to open and close eyelids without difficulty. EOM intact and pain-free bilaterally.  Pupils equal round and reactive bilaterally.  Subconjunctival hemorrhage of the left inner eye.  No hyphema.   No discharge from the left eye  Visual acuity performed with patient's glasses on, 20/25 vision in the right eye, left eye, and both eyes  Intraocular pressure of left eye 21  Fluorescein exam of left eye without corneal abrasion or globe rupture noted (no seidel sign).  No foreign body noted  Pulmonary:     Effort: Pulmonary effort is normal.   Neurological:  General: No focal deficit present.     Mental Status: He is alert.   Psychiatric:        Mood and Affect: Mood normal.        Behavior: Behavior normal.     (all labs ordered are listed, but only abnormal results are displayed) Labs Reviewed - No data to display  EKG: None  Radiology: CT Maxillofacial Wo Contrast Result Date: 08/01/2023 CLINICAL DATA:  Facial trauma. EXAM: CT MAXILLOFACIAL WITHOUT CONTRAST; CT ORBITS WITHOUT CONTRAST TECHNIQUE: Multidetector CT imaging of the maxillofacial structures was performed. Multiplanar CT image reconstructions were also generated. RADIATION DOSE REDUCTION: This exam was performed according to the departmental dose-optimization program which includes automated exposure control, adjustment of the  mA and/or kV according to patient size and/or use of iterative reconstruction technique. COMPARISON:  None Available. FINDINGS: Osseous: No fracture or mandibular dislocation. No destructive process. Orbits: Negative. No traumatic or inflammatory finding. Sinuses: Clear. Soft tissues: Negative. Limited intracranial: No significant or unexpected finding. IMPRESSION: No evidence for facial bone fracture. Electronically Signed   By: Waddell Calk M.D.   On: 08/01/2023 10:29   CT Orbits Wo Contrast Result Date: 08/01/2023 CLINICAL DATA:  Facial trauma. EXAM: CT MAXILLOFACIAL WITHOUT CONTRAST; CT ORBITS WITHOUT CONTRAST TECHNIQUE: Multidetector CT imaging of the maxillofacial structures was performed. Multiplanar CT image reconstructions were also generated. RADIATION DOSE REDUCTION: This exam was performed according to the departmental dose-optimization program which includes automated exposure control, adjustment of the mA and/or kV according to patient size and/or use of iterative reconstruction technique. COMPARISON:  None Available. FINDINGS: Osseous: No fracture or mandibular dislocation. No destructive process. Orbits: Negative. No traumatic or inflammatory finding. Sinuses: Clear. Soft tissues: Negative. Limited intracranial: No significant or unexpected finding. IMPRESSION: No evidence for facial bone fracture. Electronically Signed   By: Waddell Calk M.D.   On: 08/01/2023 10:29     Procedures   Medications Ordered in the ED  fluorescein ophthalmic strip 1 strip (1 strip Left Eye Given by Other 08/01/23 1201)  tetracaine (PONTOCAINE) 0.5 % ophthalmic solution 2 drop (2 drops Left Eye Given by Other 08/01/23 1201)                                    Medical Decision Making Amount and/or Complexity of Data Reviewed Radiology: ordered.  Risk Prescription drug management.     Differential diagnosis includes but is not limited to skull bone fracture, bone contusion, abrasion, corneal  abrasion, globe rupture, hyphema, subconjunctival hemorrhage  ED Course:  Upon initial evaluation, patient is well-appearing, no acute distress.  Stable vitals aside from his elevated blood pressure of 165/93.  He hit his left eye 2 days ago on the floor.  He does have some mild edema surrounding the left eye.  Scabbed over abrasion to the left eyebrow without any surrounding erythema, pus drainage, no concern for infection. CT orbits and CT maxillofacial without any facial bone fracture noted.  Left eye with some conjunctival hemorrhage of the medial aspect of the eye.  No hyphema.  He has intact visual acuity as per exam above.  EOM intact and pain-free.  Pupils equal round reactive.  On fluorescein exam, no evidence of corneal abrasion, or globe rupture.  Intraocular pressure within normal limits.  Concern for other acute eye injury that would require further intervention.   Stable and appropriate for discharge home   Imaging Studies ordered: I  ordered imaging studies including CT maxillofacial, CT orbits I independently visualized the imaging with scope of interpretation limited to determining acute life threatening conditions related to emergency care. Imaging showed no facial bone fracture I agree with the radiologist interpretation   Medications Given: Tetracaine drops and fluorescein  Impression: Left eye subconjunctival hemorrhage  Disposition:  The patient was discharged home with instructions to ice the area around the eye to help with pain and swelling.  Tylenol  and ibuprofen  as needed for pain.  We discussed that the subconjunctival hemorrhage will reabsorb on its own with time.  Recommended follow-up with his optometrist/ophthalmologist within the next week if symptoms not improving. Return precautions given.     This chart was dictated using voice recognition software, Dragon. Despite the best efforts of this provider to proofread and correct errors, errors may still  occur which can change documentation meaning.       Final diagnoses:  Subconjunctival hemorrhage of left eye    ED Discharge Orders     None          Veta Palma, PA-C 08/01/23 1217    Elnor Savant A, DO 08/03/23 1929

## 2023-08-01 NOTE — Discharge Instructions (Addendum)
 Your CT scan was reassuring today.  There is no signs of a fracture of the skull bones that surrounds your eye.    You have a subconjunctival hemorrhage (bleeding in the white part of your eye).  The blood will reabsorb on its own with time.  You may take up to 1000mg  of tylenol  every 6 hours as needed for pain.  Do not take more then 4g per day.  You may use up to 600mg  ibuprofen  every 6 hours as needed for pain.  Do not exceed 2.4g of ibuprofen  per day.  You may ice around the eye to help with pain and swelling.  Please follow-up with your optometrist/ophthalmologist if your symptoms are not starting to improve within the next week.  Please return to the ER, or follow-up immediately with your optometrist/ophthalmologist if you have any sudden changes in vision, severe worsening of pain in your eye, any other new or concerning symptoms.

## 2023-08-01 NOTE — ED Notes (Signed)
 Pt alert and oriented X 4 at the time of discharge. RR even and unlabored. No acute distress noted. Pt verbalized understanding of discharge instructions as discussed. Pt ambulatory to lobby at time of discharge.
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
## 2023-09-30 DIAGNOSIS — Z800 Family history of malignant neoplasm of digestive organs: Secondary | ICD-10-CM | POA: Diagnosis not present

## 2023-09-30 DIAGNOSIS — Z1211 Encounter for screening for malignant neoplasm of colon: Secondary | ICD-10-CM | POA: Diagnosis not present

## 2023-09-30 DIAGNOSIS — Z860100 Personal history of colon polyps, unspecified: Secondary | ICD-10-CM | POA: Diagnosis not present

## 2023-09-30 DIAGNOSIS — I10 Essential (primary) hypertension: Secondary | ICD-10-CM | POA: Diagnosis not present

## 2023-10-29 ENCOUNTER — Encounter: Payer: Self-pay | Admitting: Psychology

## 2023-11-05 DIAGNOSIS — Z1211 Encounter for screening for malignant neoplasm of colon: Secondary | ICD-10-CM | POA: Diagnosis not present

## 2023-11-05 DIAGNOSIS — K648 Other hemorrhoids: Secondary | ICD-10-CM | POA: Diagnosis not present

## 2023-11-05 DIAGNOSIS — Z800 Family history of malignant neoplasm of digestive organs: Secondary | ICD-10-CM | POA: Diagnosis not present

## 2023-11-05 DIAGNOSIS — Z860100 Personal history of colon polyps, unspecified: Secondary | ICD-10-CM | POA: Diagnosis not present

## 2023-11-05 LAB — HM COLONOSCOPY

## 2023-12-09 ENCOUNTER — Encounter: Payer: Self-pay | Admitting: Psychology

## 2023-12-09 ENCOUNTER — Encounter: Attending: Psychology | Admitting: Psychology

## 2023-12-09 DIAGNOSIS — H9313 Tinnitus, bilateral: Secondary | ICD-10-CM | POA: Diagnosis present

## 2023-12-09 DIAGNOSIS — F4312 Post-traumatic stress disorder, chronic: Secondary | ICD-10-CM | POA: Diagnosis present

## 2023-12-09 DIAGNOSIS — G4752 REM sleep behavior disorder: Secondary | ICD-10-CM | POA: Insufficient documentation

## 2023-12-09 DIAGNOSIS — R6889 Other general symptoms and signs: Secondary | ICD-10-CM | POA: Diagnosis present
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
## 2023-12-30 ENCOUNTER — Ambulatory Visit: Payer: Self-pay | Admitting: Nurse Practitioner

## 2024-02-08 ENCOUNTER — Ambulatory Visit (INDEPENDENT_AMBULATORY_CARE_PROVIDER_SITE_OTHER): Admitting: Neurology

## 2024-02-08 ENCOUNTER — Encounter: Payer: Self-pay | Admitting: Neurology

## 2024-02-08 VITALS — BP 150/90 | HR 89 | Ht 70.0 in | Wt 185.2 lb

## 2024-02-08 DIAGNOSIS — R9401 Abnormal electroencephalogram [EEG]: Secondary | ICD-10-CM | POA: Diagnosis not present

## 2024-02-08 NOTE — Patient Instructions (Signed)
 Will obtain a routine EEG, if normal will proceed with a 3 day ambulatory EEG  Continue your other medications  Return if worse
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
## 2024-02-15 ENCOUNTER — Ambulatory Visit: Admitting: *Deleted

## 2024-02-15 DIAGNOSIS — R9401 Abnormal electroencephalogram [EEG]: Secondary | ICD-10-CM

## 2024-02-15 DIAGNOSIS — R569 Unspecified convulsions: Secondary | ICD-10-CM

## 2024-02-15 NOTE — Procedures (Signed)
" ° °  History:  74 year old man with abnormal EEG   EEG classification:  Awake and asleep  Duration: 30 minutes   Technical aspects: This EEG study was done with scalp electrodes positioned according to the 10-20 International system of electrode placement. Electrical activity was reviewed with band pass filter of 1-70Hz , sensitivity of 7 uV/mm, display speed of 81mm/sec with a 60Hz  notched filter applied as appropriate. EEG data were recorded continuously and digitally stored.   Description of the recording: The background rhythms of this recording consists of a fairly well modulated medium amplitude background activity of 10-11 Hz. As the record progresses, the patient initially is in the waking state, but appears to enter the early stage II sleep during the recording, with rudimentary sleep spindles and vertex sharp wave activity seen. During the wakeful state, photic stimulation was performed, and no abnormal responses were seen. Hyperventilation was also performed, no abnormal response seen. No epileptiform discharges seen during this recording. There was no focal slowing.   Abnormality: None   Impression: This is a normal awake and sleep EEG. No evidence of interictal epileptiform discharges. Normal EEGs, however, do not rule out epilepsy.    Pastor Falling, MD [facility]  "

## 2024-02-16 ENCOUNTER — Ambulatory Visit: Payer: Self-pay | Admitting: Neurology

## 2024-02-16 DIAGNOSIS — R9401 Abnormal electroencephalogram [EEG]: Secondary | ICD-10-CM

## 2024-02-17 ENCOUNTER — Telehealth: Payer: Self-pay

## 2024-02-17 NOTE — Telephone Encounter (Signed)
 72 hour EEG order faxed to [facility].

## 2024-03-02 NOTE — Telephone Encounter (Signed)
 Zelda or Zanobia, will one of you complete the [facility] referral form to request ambulatory EEG for patient? Reason for request is we need to evaluate further with extended video EEG as patient had a recent abnormal EEG.

## 2024-03-02 NOTE — Telephone Encounter (Signed)
 Heron from [facility] has called asking as soon as RN is able to please fax over the [facility] Approval referral for the EEG , no call requested but she did give her # and fax Ph [phone] fax [phone]

## 2024-03-09 NOTE — Telephone Encounter (Signed)
 Spoke with Zanobia who states she received a call right away from the [place] who told her the ambulatory EEG is included in the [place] auth that we already have on file. Zanobia gave me a copy. I called Heron w/ [facility]. She asks for the auth to be faxed to them.

## 2024-03-09 NOTE — Telephone Encounter (Signed)
 [facility] Authorization Form filled out and signed . I Have email Request to [facility] with Clinical notes

## 2024-03-09 NOTE — Telephone Encounter (Signed)
 [facility] auth faxed to [employer] @ [phone]. Received a receipt of confirmation.

## 2024-03-22 ENCOUNTER — Ambulatory Visit: Payer: Self-pay

## 2024-03-22 ENCOUNTER — Ambulatory Visit: Payer: Medicare Other

## 2024-04-10 ENCOUNTER — Encounter: Admitting: Psychology

## 2024-04-24 ENCOUNTER — Encounter: Admitting: Psychology

## 2024-05-08 ENCOUNTER — Encounter: Admitting: Psychology
